# Patient Record
Sex: Male | Born: 1960 | Race: White | Hispanic: No | Marital: Married | Smoking: Never smoker
Health system: Southern US, Community
[De-identification: ages and names within clinical notes are randomized; demographics above are authoritative.]

## PROBLEM LIST (undated history)

## (undated) DIAGNOSIS — F1010 Alcohol abuse, uncomplicated: Secondary | ICD-10-CM

## (undated) DIAGNOSIS — K222 Esophageal obstruction: Secondary | ICD-10-CM

## (undated) DIAGNOSIS — K219 Gastro-esophageal reflux disease without esophagitis: Secondary | ICD-10-CM

## (undated) DIAGNOSIS — M549 Dorsalgia, unspecified: Secondary | ICD-10-CM

## (undated) DIAGNOSIS — I10 Essential (primary) hypertension: Secondary | ICD-10-CM

## (undated) DIAGNOSIS — F418 Other specified anxiety disorders: Secondary | ICD-10-CM

## (undated) DIAGNOSIS — E785 Hyperlipidemia, unspecified: Secondary | ICD-10-CM

## (undated) HISTORY — PX: BACK SURGERY: SHX140

## (undated) HISTORY — PX: ELBOW SURGERY: SHX618

---

## 2019-03-07 ENCOUNTER — Emergency Department: Payer: BC Managed Care – PPO

## 2019-03-07 ENCOUNTER — Observation Stay
Admission: EM | Admit: 2019-03-07 | Discharge: 2019-03-08 | Disposition: A | Discharge status: Home / Self Care | Payer: BC Managed Care – PPO | Attending: Internal Medicine | Admitting: Internal Medicine

## 2019-03-07 ENCOUNTER — Other Ambulatory Visit: Payer: Self-pay

## 2019-03-07 DIAGNOSIS — E860 Dehydration: Secondary | ICD-10-CM | POA: Diagnosis not present

## 2019-03-07 DIAGNOSIS — Z791 Long term (current) use of non-steroidal anti-inflammatories (NSAID): Secondary | ICD-10-CM | POA: Diagnosis not present

## 2019-03-07 DIAGNOSIS — N179 Acute kidney failure, unspecified: Secondary | ICD-10-CM | POA: Diagnosis present

## 2019-03-07 DIAGNOSIS — G9340 Encephalopathy, unspecified: Secondary | ICD-10-CM | POA: Diagnosis present

## 2019-03-07 DIAGNOSIS — K219 Gastro-esophageal reflux disease without esophagitis: Secondary | ICD-10-CM | POA: Diagnosis present

## 2019-03-07 DIAGNOSIS — Z0184 Encounter for antibody response examination: Secondary | ICD-10-CM | POA: Diagnosis not present

## 2019-03-07 DIAGNOSIS — F10129 Alcohol abuse with intoxication, unspecified: Secondary | ICD-10-CM | POA: Diagnosis not present

## 2019-03-07 DIAGNOSIS — R102 Pelvic and perineal pain: Secondary | ICD-10-CM | POA: Diagnosis not present

## 2019-03-07 DIAGNOSIS — W19XXXA Unspecified fall, initial encounter: Secondary | ICD-10-CM | POA: Diagnosis not present

## 2019-03-07 DIAGNOSIS — F418 Other specified anxiety disorders: Secondary | ICD-10-CM | POA: Insufficient documentation

## 2019-03-07 DIAGNOSIS — Z23 Encounter for immunization: Secondary | ICD-10-CM | POA: Insufficient documentation

## 2019-03-07 DIAGNOSIS — Z79899 Other long term (current) drug therapy: Secondary | ICD-10-CM | POA: Insufficient documentation

## 2019-03-07 DIAGNOSIS — F10929 Alcohol use, unspecified with intoxication, unspecified: Secondary | ICD-10-CM | POA: Diagnosis present

## 2019-03-07 DIAGNOSIS — I10 Essential (primary) hypertension: Secondary | ICD-10-CM | POA: Diagnosis not present

## 2019-03-07 DIAGNOSIS — Z20828 Contact with and (suspected) exposure to other viral communicable diseases: Secondary | ICD-10-CM | POA: Diagnosis not present

## 2019-03-07 DIAGNOSIS — Z7141 Alcohol abuse counseling and surveillance of alcoholic: Secondary | ICD-10-CM | POA: Diagnosis not present

## 2019-03-07 DIAGNOSIS — E785 Hyperlipidemia, unspecified: Secondary | ICD-10-CM | POA: Insufficient documentation

## 2019-03-07 DIAGNOSIS — G8929 Other chronic pain: Secondary | ICD-10-CM | POA: Diagnosis not present

## 2019-03-07 DIAGNOSIS — R296 Repeated falls: Secondary | ICD-10-CM | POA: Diagnosis not present

## 2019-03-07 HISTORY — DX: Essential (primary) hypertension: I10

## 2019-03-07 HISTORY — DX: Alcohol abuse, uncomplicated: F10.10

## 2019-03-07 HISTORY — DX: Hyperlipidemia, unspecified: E78.5

## 2019-03-07 HISTORY — DX: Other specified anxiety disorders: F41.8

## 2019-03-07 HISTORY — DX: Gastro-esophageal reflux disease without esophagitis: K21.9

## 2019-03-07 LAB — CBC WITH DIFFERENTIAL/PLATELET
Abs Immature Granulocytes: 0.03 10*3/uL (ref 0.00–0.07)
Basophils Absolute: 0.1 10*3/uL (ref 0.0–0.1)
Basophils Relative: 1 %
Eosinophils Absolute: 0.1 10*3/uL (ref 0.0–0.5)
Eosinophils Relative: 1 %
HCT: 42 % (ref 39.0–52.0)
Hemoglobin: 13.9 g/dL (ref 13.0–17.0)
Immature Granulocytes: 0 %
Lymphocytes Relative: 20 %
Lymphs Abs: 1.7 10*3/uL (ref 0.7–4.0)
MCH: 34 pg (ref 26.0–34.0)
MCHC: 33.1 g/dL (ref 30.0–36.0)
MCV: 102.7 fL — ABNORMAL HIGH (ref 80.0–100.0)
Monocytes Absolute: 1 10*3/uL (ref 0.1–1.0)
Monocytes Relative: 11 %
Neutro Abs: 5.7 10*3/uL (ref 1.7–7.7)
Neutrophils Relative %: 67 %
Platelets: 231 10*3/uL (ref 150–400)
RBC: 4.09 MIL/uL — ABNORMAL LOW (ref 4.22–5.81)
RDW: 12.3 % (ref 11.5–15.5)
WBC: 8.5 10*3/uL (ref 4.0–10.5)
nRBC: 0 % (ref 0.0–0.2)

## 2019-03-07 LAB — URINE DRUG SCREEN, QUALITATIVE (ARMC ONLY)
Amphetamines, Ur Screen: NOT DETECTED
Barbiturates, Ur Screen: NOT DETECTED
Benzodiazepine, Ur Scrn: POSITIVE — AB
Cannabinoid 50 Ng, Ur ~~LOC~~: NOT DETECTED
Cocaine Metabolite,Ur ~~LOC~~: NOT DETECTED
MDMA (Ecstasy)Ur Screen: NOT DETECTED
Methadone Scn, Ur: NOT DETECTED
Opiate, Ur Screen: POSITIVE — AB
Phencyclidine (PCP) Ur S: NOT DETECTED
Tricyclic, Ur Screen: POSITIVE — AB

## 2019-03-07 LAB — COMPREHENSIVE METABOLIC PANEL
ALT: 37 U/L (ref 0–44)
AST: 34 U/L (ref 15–41)
Albumin: 4 g/dL (ref 3.5–5.0)
Alkaline Phosphatase: 43 U/L (ref 38–126)
Anion gap: 11 (ref 5–15)
BUN: 39 mg/dL — ABNORMAL HIGH (ref 6–20)
CO2: 23 mmol/L (ref 22–32)
Calcium: 8.8 mg/dL — ABNORMAL LOW (ref 8.9–10.3)
Chloride: 112 mmol/L — ABNORMAL HIGH (ref 98–111)
Creatinine, Ser: 3.21 mg/dL — ABNORMAL HIGH (ref 0.61–1.24)
GFR calc Af Amer: 24 mL/min — ABNORMAL LOW (ref >60)
GFR calc non Af Amer: 21 mL/min — ABNORMAL LOW (ref >60)
Glucose, Bld: 98 mg/dL (ref 70–99)
Potassium: 4.1 mmol/L (ref 3.5–5.1)
Sodium: 146 mmol/L — ABNORMAL HIGH (ref 135–145)
Total Bilirubin: 0.7 mg/dL (ref 0.3–1.2)
Total Protein: 7 g/dL (ref 6.5–8.1)

## 2019-03-07 LAB — ETHANOL: Alcohol, Ethyl (B): 368 mg/dL (ref <10)

## 2019-03-07 LAB — SALICYLATE LEVEL: Salicylate Lvl: <7 mg/dL (ref 2.8–30.0)

## 2019-03-07 MED ORDER — SODIUM CHLORIDE 0.9 % IV BOLUS
1000.0000 mL | Freq: Once | INTRAVENOUS | Status: AC
  Administered 2019-03-07: 1000 mL via INTRAVENOUS

## 2019-03-07 MED ORDER — TETANUS-DIPHTH-ACELL PERTUSSIS 5-2.5-18.5 LF-MCG/0.5 IM SUSP
0.5000 mL | Freq: Once | INTRAMUSCULAR | Status: AC
  Administered 2019-03-08: 0.5 mL via INTRAMUSCULAR
  Filled 2019-03-07: qty 0.5

## 2019-03-07 MED ORDER — SODIUM CHLORIDE 0.9 % IV SOLN
Freq: Once | INTRAVENOUS | Status: AC
  Administered 2019-03-08: via INTRAVENOUS

## 2019-03-07 NOTE — ED Provider Notes (Signed)
[facility] Emergency Department Provider Note  ____________________________________________   First MD Initiated Contact with Patient 03/07/19 2117     (approximate)  I have reviewed the triage vital signs and the nursing notes.   HISTORY  Chief Complaint Fall    HPI Corey Baker is a 58 y.o. male here with fall and altered mental status.  The patient arrives with his wife.  He reportedly has been falling more than usual over the past several days.  He has been drinking alcohol as well.  He has not been eating and drinking very much.  He seems off balance, and is slurring his words more than usual.  He states that earlier today, he was on his porch and fell, striking his head.  He has headache, and diffuse body pain.  Is chronic pelvic pain that is mildly worsened.  Scribes as aching, throbbing pain that is worse with movement and palpation.  He is got bilateral chest wall pain.  Denies any fevers or chills.  No other complaints.  Of note, he has been taking more Goody's powders in addition of his usual medications for his pain.        Past Medical History:  Diagnosis Date  . Alcohol abuse   . Depression with anxiety   . GERD (gastroesophageal reflux disease)     There are no active problems to display for this patient.   History reviewed. No pertinent surgical history.  Prior to Admission medications   Medication Sig Start Date End Date Taking? Authorizing Provider  cyclobenzaprine (FLEXERIL) 10 MG tablet Take 10 mg by mouth 3 (three) times daily.   Yes [provider]  diazepam (VALIUM) 10 MG tablet Take 10 mg by mouth at bedtime.   Yes [provider]  DULoxetine (CYMBALTA) 30 MG capsule Take 30 mg by mouth daily.   Yes [provider]  meloxicam (MOBIC) 15 MG tablet Take 15 mg by mouth daily.   Yes [provider]  pantoprazole (PROTONIX) 40 MG tablet Take 40 mg by mouth at bedtime. 11/12/18  Yes [provider]  promethazine (PHENERGAN) 50 MG tablet Take 50-100 mg by mouth at bedtime as needed for nausea or vomiting.   Yes [provider]  tadalafil (CIALIS) 5 MG tablet Take 5 mg by mouth daily.   Yes [provider]    Allergies Patient has no allergy information on record.  No family history on file.  Social History Social History   Tobacco Use  . Smoking status: Never Smoker  . Smokeless tobacco: Never Used  Substance Use Topics  . Alcohol use: Yes  . Drug use: Never    Review of Systems  Review of Systems  Constitutional: Positive for fatigue. Negative for chills and fever.  HENT: Negative for sore throat.   Respiratory: Negative for shortness of breath.   Cardiovascular: Negative for chest pain.  Gastrointestinal: Negative for abdominal pain.  Genitourinary: Negative for flank pain.  Musculoskeletal: Positive for arthralgias, gait problem and myalgias. Negative for neck pain.  Skin: Negative for rash and wound.  Allergic/Immunologic: Negative for immunocompromised state.  Neurological: Positive for weakness. Negative for numbness.  Hematological: Does not bruise/bleed easily.  All other systems reviewed and are negative.    ____________________________________________  PHYSICAL EXAM:      VITAL SIGNS: ED Triage Vitals  Enc Vitals Group     BP --      Pulse Rate 03/07/19 2112 (!) 102  Resp 03/07/19 2112 16     Temp 03/07/19 2112 98.2 F (36.8 C)     Temp Source 03/07/19 2112 Oral     SpO2 03/07/19 2112 99 %     Weight 03/07/19 2113 180 lb (81.6 kg)     Height 03/07/19 2113 6' (1.829 m)     Head Circumference --      Peak Flow --      Pain Score 03/07/19 2113 0     Pain Loc --      Pain Edu? --      Excl. in GC? --      Physical Exam Vitals signs and nursing note reviewed.  Constitutional:      General: He is not in acute distress.    Appearance: He is well-developed.  HENT:     Head: Normocephalic and atraumatic.      Comments: Superficial excoriation and abrasion to the left brow.  No proptosis.  Extraocular movements intact. Eyes:     Conjunctiva/sclera: Conjunctivae normal.  Neck:     Musculoskeletal: Neck supple.  Cardiovascular:     Rate and Rhythm: Normal rate and regular rhythm.     Heart sounds: Normal heart sounds. No murmur. No friction rub.  Pulmonary:     Effort: Pulmonary effort is normal. No respiratory distress.     Breath sounds: Normal breath sounds. No wheezing or rales.  Abdominal:     General: There is no distension.     Palpations: Abdomen is soft.     Tenderness: There is no abdominal tenderness.  Musculoskeletal:     Comments: Diffuse tenderness to bilateral upper and lower extremities, without pinpoint tenderness anywhere.  Skin:    General: Skin is warm.     Capillary Refill: Capillary refill takes less than 2 seconds.  Neurological:     Mental Status: He is alert and oriented to person, place, and time.     Motor: No abnormal muscle tone.       ____________________________________________   LABS (all labs ordered are listed, but only abnormal results are displayed)  Labs Reviewed  CBC WITH DIFFERENTIAL/PLATELET - Abnormal; Notable for the following components:      Result Value   RBC 4.09 (*)    MCV 102.7 (*)    All other components within normal limits  ETHANOL - Abnormal; Notable for the following components:   Alcohol, Ethyl (B) 368 (*)    All other components within normal limits  COMPREHENSIVE METABOLIC PANEL - Abnormal; Notable for the following components:   Sodium 146 (*)    Chloride 112 (*)    BUN 39 (*)    Creatinine, Ser 3.21 (*)    Calcium 8.8 (*)    GFR calc non Af Amer 21 (*)    GFR calc Af Amer 24 (*)    All other components within normal limits  SALICYLATE LEVEL  URINE DRUG SCREEN, QUALITATIVE ([facility] ONLY)   ____________________________  RADIOLOGY All imaging, including plain films, CT scans, and ultrasounds, independently reviewed  by me, and interpretations confirmed via formal radiology reads.  ED MD interpretation:   CT Head/C-Spine: NAICA, neg C-Spine CXR: Neg PXR: Neg  Official radiology report(s): Dg Chest 2 View  Result Date: 03/07/2019 CLINICAL DATA:  Shortness of breath EXAM: CHEST - 2 VIEW COMPARISON:  03/17/2018 FINDINGS: Low lung volumes with bibasilar atelectasis. Heart is normal size. No effusions. No acute bony abnormality. IMPRESSION: Low volumes, bibasilar atelectasis. Electronically Signed   By: Charlett NoseKevin  Dover  M.D.   On: 03/07/2019 22:01   Dg Pelvis 1-2 Views  Result Date: 03/07/2019 CLINICAL DATA:  Shortness of breath, intoxicated, fall EXAM: PELVIS - 1-2 VIEW COMPARISON:  Pelvic radiograph 03/17/2018, CT 03/17/2018 FINDINGS: No acute pelvic fractures or diastasis. Remote posttraumatic deformity of the pubic root and inferior rami bilaterally. The femoral heads remain normally located. Proximal femora are intact. Mild degenerative changes in the lower lumbar spine and both hips. Enthesopathic changes noted on the iliac crests. IMPRESSION: No acute osseous abnormality. Electronically Signed   By: Kreg Shropshire M.D.   On: 03/07/2019 22:03   Ct Head Wo Contrast  Result Date: 03/07/2019 CLINICAL DATA:  58 year old male with altered mental status. EXAM: CT HEAD WITHOUT CONTRAST CT CERVICAL SPINE WITHOUT CONTRAST TECHNIQUE: Multidetector CT imaging of the head and cervical spine was performed following the standard protocol without intravenous contrast. Multiplanar CT image reconstructions of the cervical spine were also generated. COMPARISON:  Head CT dated 03/17/2018 FINDINGS: CT HEAD FINDINGS Brain: There is mild age-related atrophy and chronic microvascular ischemic changes. There is no acute intracranial hemorrhage. No mass effect or midline shift. No extra-axial fluid collection. Vascular: No hyperdense vessel or unexpected calcification. Skull: Normal. Negative for fracture or focal lesion. Sinuses/Orbits: No  acute finding. Other: None CT CERVICAL SPINE FINDINGS Alignment: No acute subluxation. There is straightening of normal cervical lordosis which may be positional or due to muscle spasm. Skull base and vertebrae: No acute fracture. No primary bone lesion or focal pathologic process. Soft tissues and spinal canal: No prevertebral fluid or swelling. No visible canal hematoma. Disc levels: No acute findings. Degenerative changes with disc space narrowing most prominent at C4-C5. Upper chest: Negative. Other: None IMPRESSION: 1. No acute intracranial hemorrhage. Mild age-related atrophy and chronic microvascular ischemic changes. 2. No acute/traumatic cervical spine pathology. Electronically Signed   By: Elgie Collard M.D.   On: 03/07/2019 21:57   Ct Cervical Spine Wo Contrast  Result Date: 03/07/2019 CLINICAL DATA:  58 year old male with altered mental status. EXAM: CT HEAD WITHOUT CONTRAST CT CERVICAL SPINE WITHOUT CONTRAST TECHNIQUE: Multidetector CT imaging of the head and cervical spine was performed following the standard protocol without intravenous contrast. Multiplanar CT image reconstructions of the cervical spine were also generated. COMPARISON:  Head CT dated 03/17/2018 FINDINGS: CT HEAD FINDINGS Brain: There is mild age-related atrophy and chronic microvascular ischemic changes. There is no acute intracranial hemorrhage. No mass effect or midline shift. No extra-axial fluid collection. Vascular: No hyperdense vessel or unexpected calcification. Skull: Normal. Negative for fracture or focal lesion. Sinuses/Orbits: No acute finding. Other: None CT CERVICAL SPINE FINDINGS Alignment: No acute subluxation. There is straightening of normal cervical lordosis which may be positional or due to muscle spasm. Skull base and vertebrae: No acute fracture. No primary bone lesion or focal pathologic process. Soft tissues and spinal canal: No prevertebral fluid or swelling. No visible canal hematoma. Disc levels: No  acute findings. Degenerative changes with disc space narrowing most prominent at C4-C5. Upper chest: Negative. Other: None IMPRESSION: 1. No acute intracranial hemorrhage. Mild age-related atrophy and chronic microvascular ischemic changes. 2. No acute/traumatic cervical spine pathology. Electronically Signed   By: Elgie Collard M.D.   On: 03/07/2019 21:57    ____________________________________________  PROCEDURES   Procedure(s) performed (including Critical Care):  Procedures  ____________________________________________  INITIAL IMPRESSION / MDM / ASSESSMENT AND PLAN / ED COURSE  As part of my medical decision making, I reviewed the following data within the electronic MEDICAL RECORD NUMBER  Notes from prior ED visits and [place] Controlled Substance Database      *Josefina DoJoe Reasons was evaluated in Emergency Department on 03/07/2019 for the symptoms described in the history of present illness. He was evaluated in the context of the global COVID-19 pandemic, which necessitated consideration that the patient might be at risk for infection with the SARS-CoV-2 virus that causes COVID-19. Institutional protocols and algorithms that pertain to the evaluation of patients at risk for COVID-19 are in a state of rapid change based on information released by regulatory bodies including the CDC and federal and state organizations. These policies and algorithms were followed during the patient's care in the ED.  Some ED evaluations and interventions may be delayed as a result of limited staffing during the pandemic.*      Medical Decision Making: 58 year old male here with altered mental status.  I suspect this is multifactorial, primarily secondary to polypharmacy and alcohol intoxication, likely made worse by acute on chronic kidney injury causing renal insufficiency with decreased clearance of his medications.  He has no focal neurological deficits.  No fevers or symptoms to suggest infection.  Will admit for  hydration and monitoring.  ____________________________________________  FINAL CLINICAL IMPRESSION(S) / ED DIAGNOSES  Final diagnoses:  Fall, initial encounter  Encephalopathy  Acute kidney injury (HCC)  Polypharmacy     MEDICATIONS GIVEN DURING THIS VISIT:  Medications  0.9 %  sodium chloride infusion (has no administration in time range)  sodium chloride 0.9 % bolus 1,000 mL (1,000 mLs Intravenous New Bag/Given 03/07/19 2200)     ED Discharge Orders    None       Note:  This document was prepared using Dragon voice recognition software and may include unintentional dictation errors.   Shaune PollackIsaacs, Rayaan Lorah, MD 03/07/19 2306

## 2019-03-07 NOTE — H&P (Signed)
[facility] [place] [facility]   PATIENT NAME: Corey Baker    MR#:  [id number]  DATE OF BIRTH:  1960/09/18  DATE OF ADMISSION:  03/07/2019  PRIMARY CARE PHYSICIAN: Patient, No Pcp Per   REQUESTING/REFERRING PHYSICIAN: Erma Heritage, MD  CHIEF COMPLAINT:   Chief Complaint  Patient presents with  . Fall    HISTORY OF PRESENT ILLNESS:  Corey Baker  is a 58 y.o. male who presents with chief complaint as above. Patient presents to the ED with complaint of more frequent falls.  He is intoxicated.  He reportedly sustained severe pelvic injury recently, by his report.  He has been receiving PT, but states he has significant pain and has had a hard time waking well again.  He is found in ED to have AKI as well.  Hospitalists called for admission  PAST MEDICAL HISTORY:   Past Medical History:  Diagnosis Date  . Alcohol abuse   . Depression with anxiety   . GERD (gastroesophageal reflux disease)   . HLD (hyperlipidemia)   . HTN (hypertension)      PAST SURGICAL HISTORY:  History reviewed. No pertinent surgical history.  Pt denies past surgeries SOCIAL HISTORY:   Social History   Tobacco Use  . Smoking status: Never Smoker  . Smokeless tobacco: Never Used  Substance Use Topics  . Alcohol use: Yes     FAMILY HISTORY:    Family history reviewed and is non-contributory DRUG ALLERGIES:  Not on File  MEDICATIONS AT HOME:   Prior to Admission medications   Medication Sig Start Date End Date Taking? Authorizing Provider  cyclobenzaprine (FLEXERIL) 10 MG tablet Take 10 mg by mouth 3 (three) times daily.   Yes [provider]  diazepam (VALIUM) 10 MG tablet Take 10 mg by mouth at bedtime.   Yes [provider]  DULoxetine (CYMBALTA) 30 MG capsule Take 30 mg by mouth daily.   Yes [provider]  meloxicam (MOBIC) 15 MG tablet Take 15 mg by mouth daily.   Yes [provider]  pantoprazole (PROTONIX) 40 MG tablet  Take 40 mg by mouth at bedtime. 11/12/18  Yes [provider]  promethazine (PHENERGAN) 50 MG tablet Take 50-100 mg by mouth at bedtime as needed for nausea or vomiting.   Yes [provider]  tadalafil (CIALIS) 5 MG tablet Take 5 mg by mouth daily.   Yes [provider]    REVIEW OF SYSTEMS:  Review of Systems  Unable to perform ROS: Acuity of condition     VITAL SIGNS:   Vitals:   03/07/19 2112 03/07/19 2113  Pulse: (!) 102   Resp: 16   Temp: 98.2 F (36.8 C)   TempSrc: Oral   SpO2: 99%   Weight:  81.6 kg  Height:  6' (1.829 m)   Wt Readings from Last 3 Encounters:  03/07/19 81.6 kg    PHYSICAL EXAMINATION:  Physical Exam  Vitals reviewed. Constitutional: He appears well-developed and well-nourished. No distress.  HENT:  Head: Normocephalic and atraumatic.  Mouth/Throat: Oropharynx is clear and moist.  Eyes: Pupils are equal, round, and reactive to light. Conjunctivae and EOM are normal. No scleral icterus.  Neck: Normal range of motion. Neck supple. No JVD present. No thyromegaly present.  Cardiovascular: Regular rhythm and intact distal pulses. Exam reveals no gallop and no friction rub.  No murmur heard. tachycardic  Respiratory: Effort normal and breath sounds normal. No respiratory distress. He has no wheezes. He  has no rales.  GI: Soft. Bowel sounds are normal. He exhibits no distension. There is no abdominal tenderness.  Musculoskeletal: Normal range of motion.        General: No edema.     Comments: No arthritis, no gout  Lymphadenopathy:    He has no cervical adenopathy.  Neurological: He is alert. No cranial nerve deficit.  No dysarthria, no aphasia, confused, without focal neurological deficit  Skin: Skin is warm and dry. No rash noted. No erythema.  Psychiatric:  Unable to fully assess due to intoxication    LABORATORY PANEL:   CBC Recent Labs  Lab 03/07/19 2113  WBC 8.5  HGB 13.9  HCT 42.0  PLT 231    ------------------------------------------------------------------------------------------------------------------  Chemistries  Recent Labs  Lab 03/07/19 2113  NA 146*  K 4.1  CL 112*  CO2 23  GLUCOSE 98  BUN 39*  CREATININE 3.21*  CALCIUM 8.8*  AST 34  ALT 37  ALKPHOS 43  BILITOT 0.7   ------------------------------------------------------------------------------------------------------------------  Cardiac Enzymes No results for input(s): TROPONINI in the last 168 hours. ------------------------------------------------------------------------------------------------------------------  RADIOLOGY:  Dg Chest 2 View  Result Date: 03/07/2019 CLINICAL DATA:  Shortness of breath EXAM: CHEST - 2 VIEW COMPARISON:  03/17/2018 FINDINGS: Low lung volumes with bibasilar atelectasis. Heart is normal size. No effusions. No acute bony abnormality. IMPRESSION: Low volumes, bibasilar atelectasis. Electronically Signed   By: Charlett NoseKevin  Dover M.D.   On: 03/07/2019 22:01   Dg Pelvis 1-2 Views  Result Date: 03/07/2019 CLINICAL DATA:  Shortness of breath, intoxicated, fall EXAM: PELVIS - 1-2 VIEW COMPARISON:  Pelvic radiograph 03/17/2018, CT 03/17/2018 FINDINGS: No acute pelvic fractures or diastasis. Remote posttraumatic deformity of the pubic root and inferior rami bilaterally. The femoral heads remain normally located. Proximal femora are intact. Mild degenerative changes in the lower lumbar spine and both hips. Enthesopathic changes noted on the iliac crests. IMPRESSION: No acute osseous abnormality. Electronically Signed   By: Kreg ShropshirePrice  DeHay M.D.   On: 03/07/2019 22:03   Ct Head Wo Contrast  Result Date: 03/07/2019 CLINICAL DATA:  58 year old male with altered mental status. EXAM: CT HEAD WITHOUT CONTRAST CT CERVICAL SPINE WITHOUT CONTRAST TECHNIQUE: Multidetector CT imaging of the head and cervical spine was performed following the standard protocol without intravenous contrast. Multiplanar CT  image reconstructions of the cervical spine were also generated. COMPARISON:  Head CT dated 03/17/2018 FINDINGS: CT HEAD FINDINGS Brain: There is mild age-related atrophy and chronic microvascular ischemic changes. There is no acute intracranial hemorrhage. No mass effect or midline shift. No extra-axial fluid collection. Vascular: No hyperdense vessel or unexpected calcification. Skull: Normal. Negative for fracture or focal lesion. Sinuses/Orbits: No acute finding. Other: None CT CERVICAL SPINE FINDINGS Alignment: No acute subluxation. There is straightening of normal cervical lordosis which may be positional or due to muscle spasm. Skull base and vertebrae: No acute fracture. No primary bone lesion or focal pathologic process. Soft tissues and spinal canal: No prevertebral fluid or swelling. No visible canal hematoma. Disc levels: No acute findings. Degenerative changes with disc space narrowing most prominent at C4-C5. Upper chest: Negative. Other: None IMPRESSION: 1. No acute intracranial hemorrhage. Mild age-related atrophy and chronic microvascular ischemic changes. 2. No acute/traumatic cervical spine pathology. Electronically Signed   By: Elgie CollardArash  Radparvar M.D.   On: 03/07/2019 21:57   Ct Cervical Spine Wo Contrast  Result Date: 03/07/2019 CLINICAL DATA:  58 year old male with altered mental status. EXAM: CT HEAD WITHOUT CONTRAST CT CERVICAL SPINE WITHOUT CONTRAST TECHNIQUE: Multidetector  CT imaging of the head and cervical spine was performed following the standard protocol without intravenous contrast. Multiplanar CT image reconstructions of the cervical spine were also generated. COMPARISON:  Head CT dated 03/17/2018 FINDINGS: CT HEAD FINDINGS Brain: There is mild age-related atrophy and chronic microvascular ischemic changes. There is no acute intracranial hemorrhage. No mass effect or midline shift. No extra-axial fluid collection. Vascular: No hyperdense vessel or unexpected calcification. Skull:  Normal. Negative for fracture or focal lesion. Sinuses/Orbits: No acute finding. Other: None CT CERVICAL SPINE FINDINGS Alignment: No acute subluxation. There is straightening of normal cervical lordosis which may be positional or due to muscle spasm. Skull base and vertebrae: No acute fracture. No primary bone lesion or focal pathologic process. Soft tissues and spinal canal: No prevertebral fluid or swelling. No visible canal hematoma. Disc levels: No acute findings. Degenerative changes with disc space narrowing most prominent at C4-C5. Upper chest: Negative. Other: None IMPRESSION: 1. No acute intracranial hemorrhage. Mild age-related atrophy and chronic microvascular ischemic changes. 2. No acute/traumatic cervical spine pathology. Electronically Signed   By: Anner Crete M.D.   On: 03/07/2019 21:57    EKG:   Orders placed or performed during the hospital encounter of 03/07/19  . ED EKG  . ED EKG    IMPRESSION AND PLAN:  Principal Problem:   Alcoholic intoxication with complication ([place]) - CIWA protocol Active Problems:   AKI - Creatinine of 3 here today, baseline is ~1.  IV fluids, avoid nephrotoxins and monitor.   HTN (hypertension) - home dose antihypertensives   HLD (hyperlipidemia) - home dose antilipid   GERD (gastroesophageal reflux disease) - home dose PPI   Chart review performed and case discussed with ED provider. Labs, imaging and/or ECG reviewed by provider and discussed with patient/family. Management plans discussed with the patient and/or family.  COVID-19 status: Pending  DVT PROPHYLAXIS: SubQ heparin  GI PROPHYLAXIS:  PPI   ADMISSION STATUS: Observation  CODE STATUS: Full  TOTAL TIME TAKING CARE OF THIS PATIENT: 40 minutes.   This patient was evaluated in the context of the global COVID-19 pandemic, which necessitated consideration that the patient might be at risk for infection with the SARS-CoV-2 virus that causes COVID-19. Institutional protocols and  algorithms that pertain to the evaluation of patients at risk for COVID-19 are in a state of rapid change based on information released by regulatory bodies including the CDC and federal and state organizations. These policies and algorithms were followed to the best of this provider's knowledge to date during the patient's care at this facility.  Ethlyn Daniels 03/07/2019, 11:32 PM  [facility]  Office  [phone]  CC: Primary care physician; Patient, No Pcp Per  Note:  This document was prepared using Dragon voice recognition software and may include unintentional dictation errors.

## 2019-03-07 NOTE — ED Triage Notes (Signed)
Pt to ED via EMS from home. Pt arrives intoxicated with abrasions to head and bilateral hands. Pt states he had 2 beers earlier and fell. Pt denies any pain. Pt moving all extremities, speech clear.

## 2019-03-08 ENCOUNTER — Encounter: Payer: Self-pay | Admitting: Internal Medicine

## 2019-03-08 ENCOUNTER — Other Ambulatory Visit: Payer: Self-pay

## 2019-03-08 LAB — BASIC METABOLIC PANEL
Anion gap: 11 (ref 5–15)
BUN: 29 mg/dL — ABNORMAL HIGH (ref 6–20)
CO2: 22 mmol/L (ref 22–32)
Calcium: 8.1 mg/dL — ABNORMAL LOW (ref 8.9–10.3)
Chloride: 113 mmol/L — ABNORMAL HIGH (ref 98–111)
Creatinine, Ser: 1.62 mg/dL — ABNORMAL HIGH (ref 0.61–1.24)
GFR calc Af Amer: 55 mL/min — ABNORMAL LOW (ref >60)
GFR calc non Af Amer: 47 mL/min — ABNORMAL LOW (ref >60)
Glucose, Bld: 80 mg/dL (ref 70–99)
Potassium: 4.5 mmol/L (ref 3.5–5.1)
Sodium: 146 mmol/L — ABNORMAL HIGH (ref 135–145)

## 2019-03-08 LAB — CBC
HCT: 37.7 % — ABNORMAL LOW (ref 39.0–52.0)
Hemoglobin: 12.6 g/dL — ABNORMAL LOW (ref 13.0–17.0)
MCH: 34.3 pg — ABNORMAL HIGH (ref 26.0–34.0)
MCHC: 33.4 g/dL (ref 30.0–36.0)
MCV: 102.7 fL — ABNORMAL HIGH (ref 80.0–100.0)
Platelets: 175 10*3/uL (ref 150–400)
RBC: 3.67 MIL/uL — ABNORMAL LOW (ref 4.22–5.81)
RDW: 12.3 % (ref 11.5–15.5)
WBC: 6.3 10*3/uL (ref 4.0–10.5)
nRBC: 0 % (ref 0.0–0.2)

## 2019-03-08 MED ORDER — LORAZEPAM 1 MG PO TABS: 1.0000 mg | ORAL_TABLET | Freq: Four times a day (QID) | ORAL | Status: DC | PRN

## 2019-03-08 MED ORDER — THIAMINE HCL 100 MG/ML IJ SOLN: 100.0000 mg | Freq: Every day | INTRAMUSCULAR | Status: DC

## 2019-03-08 MED ORDER — ADULT MULTIVITAMIN W/MINERALS CH
1.0000 | ORAL_TABLET | Freq: Every day | ORAL | Status: DC
  Administered 2019-03-08: 10:00:00 1 via ORAL
  Filled 2019-03-08: qty 1

## 2019-03-08 MED ORDER — LORAZEPAM 2 MG/ML IJ SOLN: 0.0000 mg | Freq: Two times a day (BID) | INTRAMUSCULAR | Status: DC

## 2019-03-08 MED ORDER — LORAZEPAM 2 MG/ML IJ SOLN: 0.0000 mg | Freq: Four times a day (QID) | INTRAMUSCULAR | Status: DC

## 2019-03-08 MED ORDER — ACETAMINOPHEN 325 MG PO TABS: 650.0000 mg | ORAL_TABLET | Freq: Four times a day (QID) | ORAL | Status: DC | PRN

## 2019-03-08 MED ORDER — ONDANSETRON HCL 4 MG/2ML IJ SOLN: 4.0000 mg | Freq: Four times a day (QID) | INTRAMUSCULAR | Status: DC | PRN

## 2019-03-08 MED ORDER — ACETAMINOPHEN 650 MG RE SUPP: 650.0000 mg | Freq: Four times a day (QID) | RECTAL | Status: DC | PRN

## 2019-03-08 MED ORDER — ONDANSETRON HCL 4 MG PO TABS: 4.0000 mg | ORAL_TABLET | Freq: Four times a day (QID) | ORAL | Status: DC | PRN

## 2019-03-08 MED ORDER — LORAZEPAM 2 MG/ML IJ SOLN: 1.0000 mg | Freq: Four times a day (QID) | INTRAMUSCULAR | Status: DC | PRN

## 2019-03-08 MED ORDER — FOLIC ACID 1 MG PO TABS
1.0000 mg | ORAL_TABLET | Freq: Every day | ORAL | Status: DC
  Administered 2019-03-08: 10:00:00 1 mg via ORAL
  Filled 2019-03-08: qty 1

## 2019-03-08 MED ORDER — HEPARIN SODIUM (PORCINE) 5000 UNIT/ML IJ SOLN
5000.0000 [IU] | Freq: Three times a day (TID) | INTRAMUSCULAR | Status: DC
  Administered 2019-03-08: 5000 [IU] via SUBCUTANEOUS

## 2019-03-08 MED ORDER — VITAMIN B-1 100 MG PO TABS
100.0000 mg | ORAL_TABLET | Freq: Every day | ORAL | Status: DC
  Administered 2019-03-08: 10:00:00 100 mg via ORAL
  Filled 2019-03-08: qty 1

## 2019-03-08 NOTE — Progress Notes (Signed)
Advanced care plan. Purpose of the Encounter: CODE STATUS Parties in Attendance: Patient Patient's Decision Capacity: Good Subjective/Patient's story: Corey Baker  is a 58 y.o. male who presents with chief complaint as above. Patient presents to the ED with complaint of more frequent falls.  He is intoxicated.  He reportedly sustained severe pelvic injury recently, by his report.  He has been receiving PT, but states he has significant pain and has had a hard time waking well again.  He is found in ED to have AKI as well.  Objective/Medical story Patient needs IV fluids and renal function monitoring Appears dehydrated Needs CIWA protocol for alcohol abuse Goals of care determination:  Advance care directives goals of care treatment plan discussed No patient wants everything done which includes CPR, intubation ventilator if the need arises CODE STATUS: Full code Time spent discussing advanced care planning: 16 minutes

## 2019-03-08 NOTE — Plan of Care (Signed)
  Problem: Activity: Goal: Risk for activity intolerance will decrease Outcome: Progressing   Patient walked 139ft around nurses station without any assistive devices.

## 2019-03-08 NOTE — Progress Notes (Signed)
Patient cleared for discharge.   Education complete. AVS printed. Discharge instructions given. All questions answered for patient clarification.  Prescriptions given, pharmacy verified.  IV removed.  Discharged to home via POV     

## 2019-03-08 NOTE — ED Notes (Signed)
Attempted to call report

## 2019-03-08 NOTE — Discharge Summary (Signed)
[facility] [place] NAME: Corey Baker    MR#:  [id number]  DATE OF BIRTH:  1960/09/03  DATE OF ADMISSION:  03/07/2019 ADMITTING PHYSICIAN: Lance Coon, MD  DATE OF DISCHARGE: 03/08/2019  PRIMARY CARE PHYSICIAN: Patient, No Pcp Per   ADMISSION DIAGNOSIS:  Encephalopathy [G93.40] Polypharmacy [Z79.899] Acute kidney injury ([place]) [N17.9] Fall, initial encounter [W19.XXXA]  DISCHARGE DIAGNOSIS:  Principal Problem:   Alcoholic intoxication with complication ([place]) Active Problems:   HTN (hypertension)   HLD (hyperlipidemia)   GERD (gastroesophageal reflux disease) Dehydration Acute kidney injury  SECONDARY DIAGNOSIS:   Past Medical History:  Diagnosis Date  . Alcohol abuse   . Depression with anxiety   . GERD (gastroesophageal reflux disease)   . HLD (hyperlipidemia)   . HTN (hypertension)      ADMITTING HISTORY Corey Baker  is a 58 y.o. male who presents with chief complaint as above. Patient presents to the ED with complaint of more frequent falls.  He is intoxicated.  He reportedly sustained severe pelvic injury recently, by his report.  He has been receiving PT, but states he has significant pain and has had a hard time waking well again.  He is found in ED to have AKI as well  HOSPITAL COURSE:  Patient admitted to medical floor.  He was worked up with CT head, CT cervical spine and x-ray of the chest.  Patient was dehydrated with renal failure.  He received IV fluids and renal function improved.  Patient was placed on CIWA protocol for alcohol abuse.  Thiamine and folic acid supplements were given.  Alcohol cessation was counseled to the patient.  Advised patient to join alcohol Anonymous groups in the community for support.  CONSULTS OBTAINED:    DRUG ALLERGIES:  Not on File  DISCHARGE MEDICATIONS:   Allergies as of 03/08/2019   Not on File     Medication List    STOP taking these medications   promethazine 50 MG  tablet Commonly known as: PHENERGAN     TAKE these medications   cyclobenzaprine 10 MG tablet Commonly known as: FLEXERIL Take 10 mg by mouth 3 (three) times daily.   diazepam 10 MG tablet Commonly known as: VALIUM Take 10 mg by mouth at bedtime.   DULoxetine 30 MG capsule Commonly known as: CYMBALTA Take 30 mg by mouth daily.   meloxicam 15 MG tablet Commonly known as: MOBIC Take 15 mg by mouth daily.   pantoprazole 40 MG tablet Commonly known as: PROTONIX Take 40 mg by mouth at bedtime.   tadalafil 5 MG tablet Commonly known as: CIALIS Take 5 mg by mouth daily.       Today  Patient seen today Tolerated diet well Hemodynamically stable VITAL SIGNS:  Blood pressure (!) 153/93, pulse 81, temperature 97.6 F (36.4 C), temperature source Oral, resp. rate 18, height 6' (1.829 m), weight 81.6 kg, SpO2 93 %.  I/O:    Intake/Output Summary (Last 24 hours) at 03/08/2019 1206 Last data filed at 03/08/2019 0600 Gross per 24 hour  Intake 1240 ml  Output 400 ml  Net 840 ml    PHYSICAL EXAMINATION:  Physical Exam  GENERAL:  58 y.o.-year-old patient lying in the bed with no acute distress.  LUNGS: Normal breath sounds bilaterally, no wheezing, rales,rhonchi or crepitation. No use of accessory muscles of respiration.  CARDIOVASCULAR: S1, S2 normal. No murmurs, rubs, or gallops.  ABDOMEN: Soft, non-tender, non-distended. Bowel sounds present. No organomegaly or mass.  NEUROLOGIC: Moves all 4 extremities. PSYCHIATRIC: The patient is alert and oriented x 3.  SKIN: No obvious rash, lesion, or ulcer.   DATA REVIEW:   CBC Recent Labs  Lab 03/08/19 0450  WBC 6.3  HGB 12.6*  HCT 37.7*  PLT 175    Chemistries  Recent Labs  Lab 03/07/19 2113 03/08/19 0450  NA 146* 146*  K 4.1 4.5  CL 112* 113*  CO2 23 22  GLUCOSE 98 80  BUN 39* 29*  CREATININE 3.21* 1.62*  CALCIUM 8.8* 8.1*  AST 34  --   ALT 37  --   ALKPHOS 43  --   BILITOT 0.7  --     Cardiac  Enzymes No results for input(s): TROPONINI in the last 168 hours.  Microbiology Results  No results found for this or any previous visit.  RADIOLOGY:  Dg Chest 2 View  Result Date: 03/07/2019 CLINICAL DATA:  Shortness of breath EXAM: CHEST - 2 VIEW COMPARISON:  03/17/2018 FINDINGS: Low lung volumes with bibasilar atelectasis. Heart is normal size. No effusions. No acute bony abnormality. IMPRESSION: Low volumes, bibasilar atelectasis. Electronically Signed   By: Charlett NoseKevin  Dover M.D.   On: 03/07/2019 22:01   Dg Pelvis 1-2 Views  Result Date: 03/07/2019 CLINICAL DATA:  Shortness of breath, intoxicated, fall EXAM: PELVIS - 1-2 VIEW COMPARISON:  Pelvic radiograph 03/17/2018, CT 03/17/2018 FINDINGS: No acute pelvic fractures or diastasis. Remote posttraumatic deformity of the pubic root and inferior rami bilaterally. The femoral heads remain normally located. Proximal femora are intact. Mild degenerative changes in the lower lumbar spine and both hips. Enthesopathic changes noted on the iliac crests. IMPRESSION: No acute osseous abnormality. Electronically Signed   By: Kreg ShropshirePrice  DeHay M.D.   On: 03/07/2019 22:03   Ct Head Wo Contrast  Result Date: 03/07/2019 CLINICAL DATA:  58 year old male with altered mental status. EXAM: CT HEAD WITHOUT CONTRAST CT CERVICAL SPINE WITHOUT CONTRAST TECHNIQUE: Multidetector CT imaging of the head and cervical spine was performed following the standard protocol without intravenous contrast. Multiplanar CT image reconstructions of the cervical spine were also generated. COMPARISON:  Head CT dated 03/17/2018 FINDINGS: CT HEAD FINDINGS Brain: There is mild age-related atrophy and chronic microvascular ischemic changes. There is no acute intracranial hemorrhage. No mass effect or midline shift. No extra-axial fluid collection. Vascular: No hyperdense vessel or unexpected calcification. Skull: Normal. Negative for fracture or focal lesion. Sinuses/Orbits: No acute finding. Other:  None CT CERVICAL SPINE FINDINGS Alignment: No acute subluxation. There is straightening of normal cervical lordosis which may be positional or due to muscle spasm. Skull base and vertebrae: No acute fracture. No primary bone lesion or focal pathologic process. Soft tissues and spinal canal: No prevertebral fluid or swelling. No visible canal hematoma. Disc levels: No acute findings. Degenerative changes with disc space narrowing most prominent at C4-C5. Upper chest: Negative. Other: None IMPRESSION: 1. No acute intracranial hemorrhage. Mild age-related atrophy and chronic microvascular ischemic changes. 2. No acute/traumatic cervical spine pathology. Electronically Signed   By: Elgie CollardArash  Radparvar M.D.   On: 03/07/2019 21:57   Ct Cervical Spine Wo Contrast  Result Date: 03/07/2019 CLINICAL DATA:  58 year old male with altered mental status. EXAM: CT HEAD WITHOUT CONTRAST CT CERVICAL SPINE WITHOUT CONTRAST TECHNIQUE: Multidetector CT imaging of the head and cervical spine was performed following the standard protocol without intravenous contrast. Multiplanar CT image reconstructions of the cervical spine were also generated. COMPARISON:  Head CT dated 03/17/2018 FINDINGS: CT HEAD FINDINGS Brain: There is mild  age-related atrophy and chronic microvascular ischemic changes. There is no acute intracranial hemorrhage. No mass effect or midline shift. No extra-axial fluid collection. Vascular: No hyperdense vessel or unexpected calcification. Skull: Normal. Negative for fracture or focal lesion. Sinuses/Orbits: No acute finding. Other: None CT CERVICAL SPINE FINDINGS Alignment: No acute subluxation. There is straightening of normal cervical lordosis which may be positional or due to muscle spasm. Skull base and vertebrae: No acute fracture. No primary bone lesion or focal pathologic process. Soft tissues and spinal canal: No prevertebral fluid or swelling. No visible canal hematoma. Disc levels: No acute findings.  Degenerative changes with disc space narrowing most prominent at C4-C5. Upper chest: Negative. Other: None IMPRESSION: 1. No acute intracranial hemorrhage. Mild age-related atrophy and chronic microvascular ischemic changes. 2. No acute/traumatic cervical spine pathology. Electronically Signed   By: Elgie Collard M.D.   On: 03/07/2019 21:57    Follow up with PCP in 1 week.  Management plans discussed with the patient, family and they are in agreement.  CODE STATUS: Full code    Code Status Orders  (From admission, onward)         Start     Ordered   03/08/19 0228  Full code  Continuous     03/08/19 0227        Code Status History    This patient has a current code status but no historical code status.   Advance Care Planning Activity      TOTAL TIME TAKING CARE OF THIS PATIENT ON DAY OF DISCHARGE: more than 33 minutes.   Ihor Austin M.D on 03/08/2019 at 12:06 PM  Between 7am to 6pm - Pager - [phone]  After 6pm go to [email or web address] - password [email or web address]  [facility]  Office  [phone]  CC: Primary care physician; Patient, No Pcp Per  Note: This dictation was prepared with Dragon dictation along with smaller phrase technology. Any transcriptional errors that result from this process are unintentional.

## 2019-03-08 NOTE — ED Notes (Signed)
ED TO INPATIENT HANDOFF REPORT  ED Nurse Name and Phone #: gracie  S Name/Age/Gender Corey Baker 58 y.o. male Room/Bed: ED25A/ED25A  Code Status   Code Status: Not on file  Home/SNF/Other Home Patient oriented to: self, place, time and situation Is this baseline? Yes   Triage Complete: Triage complete  Chief Complaint Fall  Triage Note Pt to ED via EMS from home. Pt arrives intoxicated with abrasions to head and bilateral hands. Pt states he had 2 beers earlier and fell. Pt denies any pain. Pt moving all extremities, speech clear.   Allergies Not on File  Level of Care/Admitting Diagnosis ED Disposition    ED Disposition Condition Comment   Admit  [facility]ospital [facility] [[id number]]  Level of Care: Med-Surg [16]  Covid Evaluation: Asymptomatic Screening Protocol (No Symptoms)  Diagnosis: Alcoholic intoxication with complication [facility](HCC) [[id number]]) [[id number]]  Admitting Physician: Oralia ManisWILLIS, DAVID [[id number]]  Attending Physician: Oralia ManisWILLIS, DAVID [[id number]]  PT Class (Do Not Modify): Observation [104]  PT Acc Code (Do Not Modify): Observation [10022]       B Medical/Surgery History Past Medical History:  Diagnosis Date  . Alcohol abuse   . Depression with anxiety   . GERD (gastroesophageal reflux disease)   . HLD (hyperlipidemia)   . HTN (hypertension)    History reviewed. No pertinent surgical history.   A IV Location/Drains/Wounds Patient Lines/Drains/Airways Status   Active Line/Drains/Airways    Name:   Placement date:   Placement time:   Site:   Days:   Peripheral IV 03/07/19 Left Hand   03/07/19    2122    Hand   1          Intake/Output Last 24 hours  Intake/Output Summary (Last 24 hours) at 03/08/2019 0144 Last data filed at 03/08/2019 0014 Gross per 24 hour  Intake 1000 ml  Output -  Net 1000 ml    Labs/Imaging Results for orders placed or performed during the hospital encounter of 03/07/19 (from the past 48 hour(s))  CBC with  Differential     Status: Abnormal   Collection Time: 03/07/19  9:13 PM  Result Value Ref Range   WBC 8.5 4.0 - 10.5 K/uL   RBC 4.09 (L) 4.22 - 5.81 MIL/uL   Hemoglobin 13.9 13.0 - 17.0 g/dL   HCT 57.842.0 46.939.0 - 62.952.0 %   MCV 102.7 (H) 80.0 - 100.0 fL   MCH 34.0 26.0 - 34.0 pg   MCHC 33.1 30.0 - 36.0 g/dL   RDW 52.812.3 41.311.5 - 24.415.5 %   Platelets 231 150 - 400 K/uL   nRBC 0.0 0.0 - 0.2 %   Neutrophils Relative % 67 %   Neutro Abs 5.7 1.7 - 7.7 K/uL   Lymphocytes Relative 20 %   Lymphs Abs 1.7 0.7 - 4.0 K/uL   Monocytes Relative 11 %   Monocytes Absolute 1.0 0.1 - 1.0 K/uL   Eosinophils Relative 1 %   Eosinophils Absolute 0.1 0.0 - 0.5 K/uL   Basophils Relative 1 %   Basophils Absolute 0.1 0.0 - 0.1 K/uL   Immature Granulocytes 0 %   Abs Immature Granulocytes 0.03 0.00 - 0.07 K/uL    Comment: Performed at [facility], [address] [place], [place] [address]  Ethanol     Status: Abnormal   Collection Time: 03/07/19  9:13 PM  Result Value Ref Range   Alcohol, Ethyl (B) 368 (HH) <10 mg/dL    Comment: CRITICAL RESULT CALLED TO, READ BACK BY  AND VERIFIED WITH GRACIE [facility] ON 03/07/19 AT [address] (NOTE) Lowest detectable limit for serum alcohol is 10 mg/dL. For medical purposes only. Performed at [facility], [address] [place], [place] [address]   Comprehensive metabolic panel     Status: Abnormal   Collection Time: 03/07/19  9:13 PM  Result Value Ref Range   Sodium 146 (H) 135 - 145 mmol/L   Potassium 4.1 3.5 - 5.1 mmol/L   Chloride 112 (H) 98 - 111 mmol/L   CO2 23 22 - 32 mmol/L   Glucose, Bld 98 70 - 99 mg/dL   BUN 39 (H) 6 - 20 mg/dL   Creatinine, Ser 3.21 (H) 0.61 - 1.24 mg/dL   Calcium 8.8 (L) 8.9 - 10.3 mg/dL   Total Protein 7.0 6.5 - 8.1 g/dL   Albumin 4.0 3.5 - 5.0 g/dL   AST 34 15 - 41 U/L   ALT 37 0 - 44 U/L   Alkaline Phosphatase 43 38 - 126 U/L   Total Bilirubin 0.7 0.3 - 1.2 mg/dL   GFR calc non Af Amer 21 (L) >60 mL/min   GFR calc Af  Amer 24 (L) >60 mL/min   Anion gap 11 5 - 15    Comment: Performed at [facility], [address] [place], [place] [address]  Salicylate level     Status: None   Collection Time: 03/07/19  9:13 PM  Result Value Ref Range   Salicylate Lvl <0.7 2.8 - 30.0 mg/dL    Comment: Performed at [facility], [address], [place],  [address]  Urine Drug Screen, Qualitative ([facility] only)     Status: Abnormal   Collection Time: 03/07/19 10:29 PM  Result Value Ref Range   Tricyclic, Ur Screen POSITIVE (A) NONE DETECTED   Amphetamines, Ur Screen NONE DETECTED NONE DETECTED   MDMA (Ecstasy)Ur Screen NONE DETECTED NONE DETECTED   Cocaine Metabolite,Ur [place] NONE DETECTED NONE DETECTED   Opiate, Ur Screen POSITIVE (A) NONE DETECTED   Phencyclidine (PCP) Ur S NONE DETECTED NONE DETECTED   Cannabinoid 50 Ng, Ur [place] NONE DETECTED NONE DETECTED   Barbiturates, Ur Screen NONE DETECTED NONE DETECTED   Benzodiazepine, Ur Scrn POSITIVE (A) NONE DETECTED   Methadone Scn, Ur NONE DETECTED NONE DETECTED    Comment: (NOTE) Tricyclics + metabolites, urine    Cutoff 1000 ng/mL Amphetamines + metabolites, urine  Cutoff 1000 ng/mL MDMA (Ecstasy), urine              Cutoff 500 ng/mL Cocaine Metabolite, urine          Cutoff 300 ng/mL Opiate + metabolites, urine        Cutoff 300 ng/mL Phencyclidine (PCP), urine         Cutoff 25 ng/mL Cannabinoid, urine                 Cutoff 50 ng/mL Barbiturates + metabolites, urine  Cutoff 200 ng/mL Benzodiazepine, urine              Cutoff 200 ng/mL Methadone, urine                   Cutoff 300 ng/mL The urine drug screen provides only a preliminary, unconfirmed analytical test result and should not be used for non-medical purposes. Clinical consideration and professional judgment should be applied to any positive drug screen result due to possible interfering substances. A more specific alternate chemical method must be used in order to obtain  a  confirmed analytical result. Gas chromatography / mass spectrometry (GC/MS) is the preferred confirmat ory method. Performed at [facility], [address] [place], [place] [address]    Dg Chest 2 View  Result Date: 03/07/2019 CLINICAL DATA:  Shortness of breath EXAM: CHEST - 2 VIEW COMPARISON:  03/17/2018 FINDINGS: Low lung volumes with bibasilar atelectasis. Heart is normal size. No effusions. No acute bony abnormality. IMPRESSION: Low volumes, bibasilar atelectasis. Electronically Signed   By: Charlett NoseKevin  Dover M.D.   On: 03/07/2019 22:01   Dg Pelvis 1-2 Views  Result Date: 03/07/2019 CLINICAL DATA:  Shortness of breath, intoxicated, fall EXAM: PELVIS - 1-2 VIEW COMPARISON:  Pelvic radiograph 03/17/2018, CT 03/17/2018 FINDINGS: No acute pelvic fractures or diastasis. Remote posttraumatic deformity of the pubic root and inferior rami bilaterally. The femoral heads remain normally located. Proximal femora are intact. Mild degenerative changes in the lower lumbar spine and both hips. Enthesopathic changes noted on the iliac crests. IMPRESSION: No acute osseous abnormality. Electronically Signed   By: Kreg ShropshirePrice  DeHay M.D.   On: 03/07/2019 22:03   Ct Head Wo Contrast  Result Date: 03/07/2019 CLINICAL DATA:  58 year old male with altered mental status. EXAM: CT HEAD WITHOUT CONTRAST CT CERVICAL SPINE WITHOUT CONTRAST TECHNIQUE: Multidetector CT imaging of the head and cervical spine was performed following the standard protocol without intravenous contrast. Multiplanar CT image reconstructions of the cervical spine were also generated. COMPARISON:  Head CT dated 03/17/2018 FINDINGS: CT HEAD FINDINGS Brain: There is mild age-related atrophy and chronic microvascular ischemic changes. There is no acute intracranial hemorrhage. No mass effect or midline shift. No extra-axial fluid collection. Vascular: No hyperdense vessel or unexpected calcification. Skull: Normal. Negative for fracture or focal  lesion. Sinuses/Orbits: No acute finding. Other: None CT CERVICAL SPINE FINDINGS Alignment: No acute subluxation. There is straightening of normal cervical lordosis which may be positional or due to muscle spasm. Skull base and vertebrae: No acute fracture. No primary bone lesion or focal pathologic process. Soft tissues and spinal canal: No prevertebral fluid or swelling. No visible canal hematoma. Disc levels: No acute findings. Degenerative changes with disc space narrowing most prominent at C4-C5. Upper chest: Negative. Other: None IMPRESSION: 1. No acute intracranial hemorrhage. Mild age-related atrophy and chronic microvascular ischemic changes. 2. No acute/traumatic cervical spine pathology. Electronically Signed   By: Elgie CollardArash  Radparvar M.D.   On: 03/07/2019 21:57   Ct Cervical Spine Wo Contrast  Result Date: 03/07/2019 CLINICAL DATA:  58 year old male with altered mental status. EXAM: CT HEAD WITHOUT CONTRAST CT CERVICAL SPINE WITHOUT CONTRAST TECHNIQUE: Multidetector CT imaging of the head and cervical spine was performed following the standard protocol without intravenous contrast. Multiplanar CT image reconstructions of the cervical spine were also generated. COMPARISON:  Head CT dated 03/17/2018 FINDINGS: CT HEAD FINDINGS Brain: There is mild age-related atrophy and chronic microvascular ischemic changes. There is no acute intracranial hemorrhage. No mass effect or midline shift. No extra-axial fluid collection. Vascular: No hyperdense vessel or unexpected calcification. Skull: Normal. Negative for fracture or focal lesion. Sinuses/Orbits: No acute finding. Other: None CT CERVICAL SPINE FINDINGS Alignment: No acute subluxation. There is straightening of normal cervical lordosis which may be positional or due to muscle spasm. Skull base and vertebrae: No acute fracture. No primary bone lesion or focal pathologic process. Soft tissues and spinal canal: No prevertebral fluid or swelling. No visible canal  hematoma. Disc levels: No acute findings. Degenerative changes with disc space narrowing most prominent at C4-C5. Upper chest: Negative.  Other: None IMPRESSION: 1. No acute intracranial hemorrhage. Mild age-related atrophy and chronic microvascular ischemic changes. 2. No acute/traumatic cervical spine pathology. Electronically Signed   By: Elgie Collard M.D.   On: 03/07/2019 21:57    Pending Labs Unresulted Labs (From admission, onward)    Start     Ordered   03/07/19 2312  Novel Coronavirus, NAA (Hosp order, Send-out to Ref Lab; TAT 18-24 hrs  (Asymptomatic/Tier 2 Patients Labs)  ONCE - STAT,   STAT    Question Answer Comment  Is this test for diagnosis or screening Screening   Symptomatic for COVID-19 as defined by CDC No   Hospitalized for COVID-19 No   Admitted to ICU for COVID-19 No   Previously tested for COVID-19 No   Resident in a congregate (group) care setting No   Employed in healthcare setting No      03/07/19 2311   Signed and Held  HIV antibody (Routine Testing)  Once,   R     Signed and Held   Signed and Held  CBC  (heparin)  Once,   R    Comments: Baseline for heparin therapy IF NOT ALREADY DRAWN.  Notify MD if PLT < 100 K.    Signed and Held   Signed and Held  Creatinine, serum  (heparin)  Once,   R    Comments: Baseline for heparin therapy IF NOT ALREADY DRAWN.    Signed and Held   Signed and Held  Basic metabolic panel  Tomorrow morning,   R     Signed and Held   Signed and Held  CBC  Tomorrow morning,   R     Signed and Held          Vitals/Pain Today's Vitals   03/07/19 2112 03/07/19 2113 03/08/19 0044  BP:   139/88  Pulse: (!) 102    Resp: 16    Temp: 98.2 F (36.8 C)    TempSrc: Oral    SpO2: 99%    Weight:  81.6 kg   Height:  6' (1.829 m)   PainSc:  0-No pain     Isolation Precautions No active isolations  Medications Medications  sodium chloride 0.9 % bolus 1,000 mL (0 mLs Intravenous Stopped 03/08/19 0014)  0.9 %  sodium chloride  infusion ( Intravenous New Bag/Given 03/08/19 0021)  Tdap (BOOSTRIX) injection 0.5 mL (0.5 mLs Intramuscular Given 03/08/19 0021)    Mobility walks High fall risk   Focused Assessments Renal Assessment Handoff:  Hemodialysis Schedule:  Last Hemodialysis date and time:    Restricted appendage:    , Pulmonary Assessment Handoff:  Lung sounds:   O2 Device: Room Air        R Recommendations: See Admitting Provider Note  Report given to:   Additional Notes:

## 2019-03-08 NOTE — ED Notes (Signed)
Pt continuously standing and yelling across ED he needs a room or he will leave. Pt made aware it is in his best interest to stay. Pt standing up walking around, told to lay down due to instability walking, and IV hooked to fluids. Pt redirected to bed.

## 2019-03-09 LAB — NOVEL CORONAVIRUS, NAA (HOSP ORDER, SEND-OUT TO REF LAB; TAT 18-24 HRS): SARS-CoV-2, NAA: NOT DETECTED

## 2019-03-09 LAB — HIV ANTIBODY (ROUTINE TESTING W REFLEX): HIV Screen 4th Generation wRfx: NONREACTIVE

## 2019-05-15 ENCOUNTER — Other Ambulatory Visit: Payer: Self-pay

## 2019-05-15 ENCOUNTER — Telehealth: Payer: Self-pay | Admitting: Emergency Medicine

## 2019-05-15 ENCOUNTER — Emergency Department: Payer: BC Managed Care – PPO

## 2019-05-15 ENCOUNTER — Emergency Department
Admission: EM | Admit: 2019-05-15 | Discharge: 2019-05-15 | Disposition: A | Discharge status: Home / Self Care | Payer: BC Managed Care – PPO | Attending: Emergency Medicine | Admitting: Emergency Medicine

## 2019-05-15 ENCOUNTER — Encounter: Payer: Self-pay | Admitting: Emergency Medicine

## 2019-05-15 DIAGNOSIS — U071 COVID-19: Secondary | ICD-10-CM | POA: Diagnosis not present

## 2019-05-15 DIAGNOSIS — R519 Headache, unspecified: Secondary | ICD-10-CM | POA: Insufficient documentation

## 2019-05-15 DIAGNOSIS — I10 Essential (primary) hypertension: Secondary | ICD-10-CM | POA: Diagnosis not present

## 2019-05-15 DIAGNOSIS — R079 Chest pain, unspecified: Secondary | ICD-10-CM | POA: Insufficient documentation

## 2019-05-15 DIAGNOSIS — J069 Acute upper respiratory infection, unspecified: Secondary | ICD-10-CM | POA: Insufficient documentation

## 2019-05-15 DIAGNOSIS — R05 Cough: Secondary | ICD-10-CM | POA: Diagnosis present

## 2019-05-15 DIAGNOSIS — Z79899 Other long term (current) drug therapy: Secondary | ICD-10-CM | POA: Diagnosis not present

## 2019-05-15 LAB — CBC
HCT: 38.7 % — ABNORMAL LOW (ref 39.0–52.0)
Hemoglobin: 13 g/dL (ref 13.0–17.0)
MCH: 34.2 pg — ABNORMAL HIGH (ref 26.0–34.0)
MCHC: 33.6 g/dL (ref 30.0–36.0)
MCV: 101.8 fL — ABNORMAL HIGH (ref 80.0–100.0)
Platelets: 145 10*3/uL — ABNORMAL LOW (ref 150–400)
RBC: 3.8 MIL/uL — ABNORMAL LOW (ref 4.22–5.81)
RDW: 11.9 % (ref 11.5–15.5)
WBC: 3.7 10*3/uL — ABNORMAL LOW (ref 4.0–10.5)
nRBC: 0 % (ref 0.0–0.2)

## 2019-05-15 LAB — COMPREHENSIVE METABOLIC PANEL
ALT: 43 U/L (ref 0–44)
AST: 57 U/L — ABNORMAL HIGH (ref 15–41)
Albumin: 4.1 g/dL (ref 3.5–5.0)
Alkaline Phosphatase: 56 U/L (ref 38–126)
Anion gap: 11 (ref 5–15)
BUN: 21 mg/dL — ABNORMAL HIGH (ref 6–20)
CO2: 25 mmol/L (ref 22–32)
Calcium: 9 mg/dL (ref 8.9–10.3)
Chloride: 103 mmol/L (ref 98–111)
Creatinine, Ser: 1.42 mg/dL — ABNORMAL HIGH (ref 0.61–1.24)
GFR calc Af Amer: >60 mL/min (ref >60)
GFR calc non Af Amer: 55 mL/min — ABNORMAL LOW (ref >60)
Glucose, Bld: 93 mg/dL (ref 70–99)
Potassium: 3.8 mmol/L (ref 3.5–5.1)
Sodium: 139 mmol/L (ref 135–145)
Total Bilirubin: 1.1 mg/dL (ref 0.3–1.2)
Total Protein: 7.2 g/dL (ref 6.5–8.1)

## 2019-05-15 LAB — URINALYSIS, COMPLETE (UACMP) WITH MICROSCOPIC
Bacteria, UA: NONE SEEN
Bilirubin Urine: NEGATIVE
Glucose, UA: NEGATIVE mg/dL
Hgb urine dipstick: NEGATIVE
Ketones, ur: NEGATIVE mg/dL
Leukocytes,Ua: NEGATIVE
Nitrite: NEGATIVE
Protein, ur: 30 mg/dL — AB
Specific Gravity, Urine: 1.026 (ref 1.005–1.030)
Squamous Epithelial / HPF: NONE SEEN (ref 0–5)
pH: 5 (ref 5.0–8.0)

## 2019-05-15 LAB — TROPONIN I (HIGH SENSITIVITY): Troponin I (High Sensitivity): 11 ng/L (ref <18)

## 2019-05-15 LAB — SARS CORONAVIRUS 2 (TAT 6-24 HRS): SARS Coronavirus 2: POSITIVE — AB

## 2019-05-15 MED ORDER — AZITHROMYCIN 250 MG PO TABS: ORAL_TABLET | ORAL | 0 refills | Status: AC

## 2019-05-15 MED ORDER — IBUPROFEN 600 MG PO TABS: 600.0000 mg | ORAL_TABLET | Freq: Three times a day (TID) | ORAL | 0 refills | Status: DC | PRN

## 2019-05-15 MED ORDER — PSEUDOEPH-BROMPHEN-DM 30-2-10 MG/5ML PO SYRP: 5.0000 mL | ORAL_SOLUTION | Freq: Four times a day (QID) | ORAL | 0 refills | Status: AC | PRN

## 2019-05-15 NOTE — ED Notes (Signed)
See triage note  States he developed runny nose cough and subjective fever   Sx's started couple of days ago

## 2019-05-15 NOTE — Telephone Encounter (Signed)
Called patients wife and then spoke to patient about his result as well.  Explained isolation guidelines and need to inform any contacts.  Advised to do  evisit or phone visit with pcp for mild to moderate symptoms and to return to ED for more concerning symptoms.

## 2019-05-15 NOTE — ED Provider Notes (Signed)
[facility] Emergency Department Provider Note   ____________________________________________   First MD Initiated Contact with Patient 05/15/19 [phone]     (approximate)  I have reviewed the triage vital signs and the nursing notes.   HISTORY  Chief Complaint Cough, Nasal Congestion, Headache, Chest Pain, and Generalized Body Aches    HPI Corey Baker is a 59 y.o. male patient presents with nonproductive cough, fatigue, burning sensation in the center of chest, and runny nose.  Patient states he just returned from [facility].  Unknown positive COVID-19 exposure.  Wife is here with similar complaints.      Past Medical History:  Diagnosis Date  . Alcohol abuse   . Depression with anxiety   . GERD (gastroesophageal reflux disease)   . HLD (hyperlipidemia)   . HTN (hypertension)     Patient Active Problem List   Diagnosis Date Noted  . HTN (hypertension) 03/07/2019  . HLD (hyperlipidemia) 03/07/2019  . GERD (gastroesophageal reflux disease) 03/07/2019  . Alcoholic intoxication with complication ([place]) 95/03/3266    Past Surgical History:  Procedure Laterality Date  . BACK SURGERY      Prior to Admission medications   Medication Sig Start Date End Date Taking? Authorizing Provider  atorvastatin (LIPITOR) 20 MG tablet Take 20 mg by mouth daily. 06/24/18  Yes [provider]  cyclobenzaprine (FLEXERIL) 10 MG tablet Take 10 mg by mouth 3 (three) times daily.   Yes [provider]  diclofenac sodium (VOLTAREN) 1 % GEL Apply topically 4 (four) times daily.   Yes [provider]  DULoxetine (CYMBALTA) 30 MG capsule Take 30 mg by mouth daily.   Yes [provider]  olmesartan-hydrochlorothiazide (BENICAR HCT) 20-12.5 MG tablet Take 1 tablet by mouth daily.   Yes [provider]  pantoprazole (PROTONIX) 40 MG tablet Take 40 mg by mouth at bedtime. 11/12/18  Yes [provider]  tadalafil  (CIALIS) 5 MG tablet Take 5 mg by mouth daily.   Yes [provider]  zolpidem (AMBIEN CR) 12.5 MG CR tablet Take 12.5 mg by mouth at bedtime as needed for sleep.   Yes [provider]  azithromycin (ZITHROMAX Z-PAK) 250 MG tablet Take 2 tablets (500 mg) on  Day 1,  followed by 1 tablet (250 mg) once daily on Days 2 through 5. 05/15/19 05/20/19  Sable Feil, PA-C  brompheniramine-pseudoephedrine-DM 30-2-10 MG/5ML syrup Take 5 mLs by mouth 4 (four) times daily as needed. 05/15/19   Sable Feil, PA-C  ibuprofen (ADVIL) 600 MG tablet Take 1 tablet (600 mg total) by mouth every 8 (eight) hours as needed. 05/15/19   Sable Feil, PA-C    Allergies Patient has no known allergies.  History reviewed. No pertinent family history.  Social History Social History   Tobacco Use  . Smoking status: Never Smoker  . Smokeless tobacco: Never Used  Substance Use Topics  . Alcohol use: Yes  . Drug use: Never    Review of Systems  Constitutional: Low-grade fever, fatigue, and body aches. Eyes: No visual changes. ENT: No sore throat. Cardiovascular: Denies chest pain. Respiratory: Denies shortness of breath. Gastrointestinal: No abdominal pain.  No nausea, no vomiting.  No diarrhea.  No constipation. Genitourinary: Negative for dysuria. Musculoskeletal: Negative for back pain. Skin: Negative for rash. Neurological: Negative for headaches, focal weakness or numbness. Psychiatric:  Alcohol use and depression. Endocrine:  Hyperlipidemia and hypertension.  ____________________________________________   PHYSICAL EXAM:  VITAL SIGNS: ED Triage Vitals  Enc Vitals Group     BP 05/15/19 0519 (!) 127/95     Pulse Rate 05/15/19 0519 (!) 114     Resp --      Temp 05/15/19 0519 99 F (37.2 C)     Temp Source 05/15/19 0519 Oral     SpO2 05/15/19 0519 98 %     Weight 05/15/19 0519 200 lb (90.7 kg)     Height 05/15/19 0519 6\' 1"  (1.854 m)     Head Circumference --      Peak  Flow --      Pain Score 05/15/19 0524 0     Pain Loc --      Pain Edu? --      Excl. in GC? --    Constitutional: Alert and oriented. Well appearing and in no acute distress. Mouth/Throat: Mucous membranes are moist.  Oropharynx non-erythematous. Neck: No stridor.   Cardiovascular: Tachycardic, regular rhythm. Grossly normal heart sounds.  Good peripheral circulation. Respiratory: Normal respiratory effort.  No retractions. Lungs CTAB. Gastrointestinal: Soft and nontender. No distention. No abdominal bruits. No CVA tenderness. Musculoskeletal: No lower extremity tenderness nor edema.  No joint effusions. Neurologic:  Normal speech and language. No gross focal neurologic deficits are appreciated. No gait instability. Skin:  Skin is warm, dry and intact. No rash noted. Psychiatric: Mood and affect are normal. Speech and behavior are normal.  ____________________________________________   LABS (all labs ordered are listed, but only abnormal results are displayed)  Labs Reviewed  CBC - Abnormal; Notable for the following components:      Result Value   WBC 3.7 (*)    RBC 3.80 (*)    HCT 38.7 (*)    MCV 101.8 (*)    MCH 34.2 (*)    Platelets 145 (*)    All other components within normal limits  COMPREHENSIVE METABOLIC PANEL - Abnormal; Notable for the following components:   BUN 21 (*)    Creatinine, Ser 1.42 (*)    AST 57 (*)    GFR calc non Af Amer 55 (*)    All other components within normal limits  URINALYSIS, COMPLETE (UACMP) WITH MICROSCOPIC - Abnormal; Notable for the following components:   Color, Urine YELLOW (*)    APPearance CLEAR (*)    Protein, ur 30 (*)    All other components within normal limits  SARS CORONAVIRUS 2 (TAT 6-24 HRS)  TROPONIN I (HIGH SENSITIVITY)  TROPONIN I (HIGH SENSITIVITY)   ____________________________________________  EKG   ____________________________________________  RADIOLOGY  ED MD interpretation:    Official radiology  report(s): Dg Chest 2 View  Result Date: 05/15/2019 CLINICAL DATA:  Chest EXAM: CHEST - 2 VIEW COMPARISON:  03/07/1999 FINDINGS: Heart size is stable. There are multiple old healed displaced right-sided rib fractures. No evidence for pneumothorax. There is scarring versus atelectasis at the lung bases, right worse than left. IMPRESSION: Streaky bibasilar airspace opacities favored to represent atelectasis, however an infiltrate is not entirely excluded. Multiple old healed displaced right-sided rib fractures are again noted. Electronically Signed   By: Katherine Mantlehristopher  Green M.D.   On: 05/15/2019 05:48    ____________________________________________   PROCEDURES  Procedure(s) performed (including Critical Care):  Procedures   ____________________________________________   INITIAL IMPRESSION / ASSESSMENT AND PLAN / ED COURSE  As part of my medical decision making, I reviewed the following data within the electronic MEDICAL RECORD NUMBER         Corey Baker was evaluated in Emergency Department on  05/15/2019 for the symptoms described in the history of present illness. He was evaluated in the context of the global COVID-19 pandemic, which necessitated consideration that the patient might be at risk for infection with the SARS-CoV-2 virus that causes COVID-19. Institutional protocols and algorithms that pertain to the evaluation of patients at risk for COVID-19 are in a state of rapid change based on information released by regulatory bodies including the CDC and federal and state organizations. These policies and algorithms were followed during the patient's care in the ED.  Patient presents with viral symptoms consisting of runny nose, headache, cough, and fatigue.  Discussed x-ray findings with patient suggestive of early infiltrate.  Patient advised self quarantine pending results of Covid test.  Patient advised take medication as directed.     ____________________________________________    FINAL CLINICAL IMPRESSION(S) / ED DIAGNOSES  Final diagnoses:  Upper respiratory tract infection, unspecified type     ED Discharge Orders         Ordered    azithromycin (ZITHROMAX Z-PAK) 250 MG tablet     05/15/19 0835    brompheniramine-pseudoephedrine-DM 30-2-10 MG/5ML syrup  4 times daily PRN     05/15/19 0835    ibuprofen (ADVIL) 600 MG tablet  Every 8 hours PRN     05/15/19 0835           Note:  This document was prepared using Dragon voice recognition software and may include unintentional dictation errors.    Joni Reining, PA-C 05/15/19 7209    Emily Filbert, MD 05/15/19 [phone]

## 2019-05-15 NOTE — Discharge Instructions (Addendum)
Advised self quarantine pending results of COVID-19 test.  If positive he was quarantine for 10 days.

## 2019-05-15 NOTE — ED Triage Notes (Addendum)
Pt is here with wife who also checked in for similar symptom; pt says his symptoms started yesterday-runny nose, headache, cough and burning pain to the center of his chest (no chest pain at present); they returned yesterday from [facility]; and a co-worker of his wife has been coughing at work for some time now; unknown COVID exposure; pt adds aching all over

## 2019-05-19 ENCOUNTER — Other Ambulatory Visit: Payer: Self-pay | Admitting: Specialist

## 2019-05-24 ENCOUNTER — Other Ambulatory Visit: Admission: RE | Admit: 2019-05-24 | Payer: BC Managed Care – PPO | Source: Ambulatory Visit

## 2019-05-25 ENCOUNTER — Other Ambulatory Visit: Payer: BC Managed Care – PPO

## 2019-06-06 ENCOUNTER — Other Ambulatory Visit: Payer: Self-pay

## 2019-06-06 ENCOUNTER — Encounter
Admission: RE | Admit: 2019-06-06 | Discharge: 2019-06-06 | Disposition: A | Discharge status: Home / Self Care | Payer: No Typology Code available for payment source | Source: Ambulatory Visit | Attending: Specialist | Admitting: Specialist

## 2019-06-06 DIAGNOSIS — Z01812 Encounter for preprocedural laboratory examination: Secondary | ICD-10-CM | POA: Diagnosis not present

## 2019-06-06 DIAGNOSIS — S83232D Complex tear of medial meniscus, current injury, left knee, subsequent encounter: Secondary | ICD-10-CM | POA: Diagnosis not present

## 2019-06-06 LAB — CBC
HCT: 40.6 % (ref 39.0–52.0)
Hemoglobin: 14 g/dL (ref 13.0–17.0)
MCH: 32.9 pg (ref 26.0–34.0)
MCHC: 34.5 g/dL (ref 30.0–36.0)
MCV: 95.5 fL (ref 80.0–100.0)
Platelets: 237 10*3/uL (ref 150–400)
RBC: 4.25 MIL/uL (ref 4.22–5.81)
RDW: 11.9 % (ref 11.5–15.5)
WBC: 8.1 10*3/uL (ref 4.0–10.5)
nRBC: 0 % (ref 0.0–0.2)

## 2019-06-06 LAB — BASIC METABOLIC PANEL
Anion gap: 13 (ref 5–15)
BUN: 29 mg/dL — ABNORMAL HIGH (ref 6–20)
CO2: 24 mmol/L (ref 22–32)
Calcium: 9.3 mg/dL (ref 8.9–10.3)
Chloride: 105 mmol/L (ref 98–111)
Creatinine, Ser: 0.95 mg/dL (ref 0.61–1.24)
GFR calc Af Amer: >60 mL/min (ref >60)
GFR calc non Af Amer: >60 mL/min (ref >60)
Glucose, Bld: 105 mg/dL — ABNORMAL HIGH (ref 70–99)
Potassium: 3.9 mmol/L (ref 3.5–5.1)
Sodium: 142 mmol/L (ref 135–145)

## 2019-06-06 NOTE — Patient Instructions (Signed)
Your procedure is scheduled on:  Monday 06/12/19.  Report to DAY SURGERY DEPARTMENT LOCATED ON 2ND FLOOR MEDICAL MALL ENTRANCE. To find out your arrival time please call [phone] between 1PM - 3PM on Friday 06/09/19.   Remember: Instructions that are not followed completely may result in serious medical risk, up to and including death, or upon the discretion of your surgeon and anesthesiologist your surgery may need to be rescheduled.      _X__ 1. Do not eat food after midnight the night before your procedure.                 No gum chewing or hard candies. You may drink clear liquids up to 2 hours                 before you are scheduled to arrive for your surgery- DO NOT drink clear                 liquids within 2 hours of the start of your surgery.                 Clear Liquids include:  water, apple juice without pulp, clear carbohydrate                 drink such as Clearfast or Gatorade, Black Coffee or Tea (Do not add                 milk or creamer to coffee or tea).    __X__2.  On the morning of surgery brush your teeth with toothpaste and water, you may rinse your mouth with mouthwash if you wish.  Do not swallow any toothpaste or mouthwash.       _X__ 3.  No Alcohol for 24 hours before or after surgery.    __X__4.  Notify your doctor if there is any change in your medical condition      (cold, fever, infections).       Do not wear jewelry, make-up, hairpins, clips or nail polish. Do not wear lotions, powders, or perfumes.  Do not shave 48 hours prior to surgery. Men may shave face and neck. Do not bring valuables to the hospital.      [facility] is not responsible for any belongings or valuables.    Contacts, dentures/partials or body piercings may not be worn into surgery. Bring a case for your contacts, glasses or hearing aids, a denture cup will be supplied.     Patients discharged the day of surgery will not be allowed to drive  home.     Please read over the following fact sheets that you were given:   MRSA Information    __X__ Take these medicines the morning of surgery with A SIP OF WATER:     1. atorvastatin (LIPITOR) 20 MG tablet  2. DULoxetine (CYMBALTA) 30 MG capsule  3. pantoprazole (PROTONIX) 40 MG tablet  4. cyclobenzaprine (FLEXERIL) 10 MG tablet if needed     __X__ Use CHG Soap as directed    __X__ Stop Anti-inflammatories 7 days before surgery such as Advil, Ibuprofen, Motrin, BC or Goodies Powder, Naprosyn, Naproxen, Aleve, Aspirin, Meloxicam. May take Tylenol if needed for pain or discomfort.     __X__ Don't start taking any new herbal supplements.

## 2019-06-11 MED ORDER — CLINDAMYCIN PHOSPHATE 600 MG/50ML IV SOLN
600.0000 mg | INTRAVENOUS | Status: AC
  Administered 2019-06-12: 600 mg via INTRAVENOUS

## 2019-06-12 ENCOUNTER — Ambulatory Visit
Admission: RE | Admit: 2019-06-12 | Discharge: 2019-06-12 | Disposition: A | Discharge status: Home / Self Care | Payer: No Typology Code available for payment source | Attending: Specialist | Admitting: Specialist

## 2019-06-12 ENCOUNTER — Ambulatory Visit: Payer: No Typology Code available for payment source | Admitting: Certified Registered Nurse Anesthetist

## 2019-06-12 ENCOUNTER — Encounter: Payer: Self-pay | Admitting: *Deleted

## 2019-06-12 ENCOUNTER — Other Ambulatory Visit: Payer: Self-pay

## 2019-06-12 ENCOUNTER — Encounter
Admission: RE | Disposition: A | Discharge status: Home / Self Care | Payer: Self-pay | Source: Home / Self Care | Attending: Specialist

## 2019-06-12 DIAGNOSIS — I10 Essential (primary) hypertension: Secondary | ICD-10-CM | POA: Insufficient documentation

## 2019-06-12 DIAGNOSIS — X58XXXA Exposure to other specified factors, initial encounter: Secondary | ICD-10-CM | POA: Diagnosis not present

## 2019-06-12 DIAGNOSIS — S83242A Other tear of medial meniscus, current injury, left knee, initial encounter: Secondary | ICD-10-CM | POA: Diagnosis present

## 2019-06-12 DIAGNOSIS — Z888 Allergy status to other drugs, medicaments and biological substances status: Secondary | ICD-10-CM | POA: Insufficient documentation

## 2019-06-12 HISTORY — PX: KNEE ARTHROSCOPY: SHX127

## 2019-06-12 SURGERY — ARTHROSCOPY, KNEE
Anesthesia: General | Site: Knee | Laterality: Left

## 2019-06-12 MED ORDER — BUPIVACAINE HCL (PF) 0.5 % IJ SOLN
INTRAMUSCULAR | Status: AC
  Filled 2019-06-12: qty 30

## 2019-06-12 MED ORDER — MELOXICAM 7.5 MG PO TABS
15.0000 mg | ORAL_TABLET | ORAL | Status: AC
  Administered 2019-06-12: 07:00:00 15 mg via ORAL

## 2019-06-12 MED ORDER — FENTANYL CITRATE (PF) 100 MCG/2ML IJ SOLN
INTRAMUSCULAR | Status: DC | PRN
  Administered 2019-06-12 (×2): 50 ug via INTRAVENOUS

## 2019-06-12 MED ORDER — ACETAMINOPHEN 10 MG/ML IV SOLN
INTRAVENOUS | Status: DC | PRN
  Administered 2019-06-12: 1000 mg via INTRAVENOUS

## 2019-06-12 MED ORDER — ACETAMINOPHEN 10 MG/ML IV SOLN
INTRAVENOUS | Status: AC
  Filled 2019-06-12: qty 100

## 2019-06-12 MED ORDER — LIDOCAINE HCL (CARDIAC) PF 100 MG/5ML IV SOSY
PREFILLED_SYRINGE | INTRAVENOUS | Status: DC | PRN
  Administered 2019-06-12: 100 mg via INTRAVENOUS

## 2019-06-12 MED ORDER — LACTATED RINGERS IV SOLN
INTRAVENOUS | Status: DC
  Administered 2019-06-12: 07:00:00 via INTRAVENOUS

## 2019-06-12 MED ORDER — ONDANSETRON HCL 4 MG/2ML IJ SOLN
INTRAMUSCULAR | Status: DC | PRN
  Administered 2019-06-12: 4 mg via INTRAVENOUS

## 2019-06-12 MED ORDER — EPINEPHRINE PF 1 MG/ML IJ SOLN
INTRAMUSCULAR | Status: AC
  Filled 2019-06-12: qty 1

## 2019-06-12 MED ORDER — CHLORHEXIDINE GLUCONATE CLOTH 2 % EX PADS: 6.0000 | MEDICATED_PAD | Freq: Once | CUTANEOUS | Status: DC

## 2019-06-12 MED ORDER — CLINDAMYCIN PHOSPHATE 600 MG/50ML IV SOLN
INTRAVENOUS | Status: AC
  Filled 2019-06-12: qty 50

## 2019-06-12 MED ORDER — ROCURONIUM BROMIDE 100 MG/10ML IV SOLN
INTRAVENOUS | Status: DC | PRN
  Administered 2019-06-12: 25 mg via INTRAVENOUS
  Administered 2019-06-12: 5 mg via INTRAVENOUS
  Administered 2019-06-12: 20 mg via INTRAVENOUS

## 2019-06-12 MED ORDER — LIDOCAINE HCL (PF) 2 % IJ SOLN
INTRAMUSCULAR | Status: AC
  Filled 2019-06-12: qty 10

## 2019-06-12 MED ORDER — GABAPENTIN 300 MG PO CAPS
300.0000 mg | ORAL_CAPSULE | ORAL | Status: AC
  Administered 2019-06-12: 07:00:00 300 mg via ORAL

## 2019-06-12 MED ORDER — MIDAZOLAM HCL 2 MG/2ML IJ SOLN
INTRAMUSCULAR | Status: DC | PRN
  Administered 2019-06-12: 2 mg via INTRAVENOUS

## 2019-06-12 MED ORDER — GABAPENTIN 400 MG PO CAPS: 400.0000 mg | ORAL_CAPSULE | Freq: Three times a day (TID) | ORAL | 3 refills | Status: DC

## 2019-06-12 MED ORDER — HYDROCODONE-ACETAMINOPHEN 5-325 MG PO TABS: 1.0000 | ORAL_TABLET | Freq: Four times a day (QID) | ORAL | 0 refills | Status: DC | PRN

## 2019-06-12 MED ORDER — PHENYLEPHRINE HCL (PRESSORS) 10 MG/ML IV SOLN
INTRAVENOUS | Status: DC | PRN
  Administered 2019-06-12 (×3): 100 ug via INTRAVENOUS
  Administered 2019-06-12: 50 ug via INTRAVENOUS
  Administered 2019-06-12: 100 ug via INTRAVENOUS
  Administered 2019-06-12: 50 ug via INTRAVENOUS

## 2019-06-12 MED ORDER — DEXAMETHASONE SODIUM PHOSPHATE 10 MG/ML IJ SOLN
INTRAMUSCULAR | Status: AC
  Filled 2019-06-12: qty 1

## 2019-06-12 MED ORDER — MIDAZOLAM HCL 2 MG/2ML IJ SOLN
INTRAMUSCULAR | Status: AC
  Filled 2019-06-12: qty 2

## 2019-06-12 MED ORDER — PROPOFOL 10 MG/ML IV BOLUS
INTRAVENOUS | Status: DC | PRN
  Administered 2019-06-12: 200 mg via INTRAVENOUS

## 2019-06-12 MED ORDER — MORPHINE SULFATE (PF) 4 MG/ML IV SOLN
INTRAVENOUS | Status: AC
  Filled 2019-06-12: qty 1

## 2019-06-12 MED ORDER — BUPIVACAINE-EPINEPHRINE (PF) 0.5% -1:200000 IJ SOLN
INTRAMUSCULAR | Status: DC | PRN
  Administered 2019-06-12: 20 mL

## 2019-06-12 MED ORDER — SUCCINYLCHOLINE CHLORIDE 20 MG/ML IJ SOLN
INTRAMUSCULAR | Status: AC
  Filled 2019-06-12: qty 1

## 2019-06-12 MED ORDER — SUCCINYLCHOLINE CHLORIDE 20 MG/ML IJ SOLN
INTRAMUSCULAR | Status: DC | PRN
  Administered 2019-06-12: 100 mg via INTRAVENOUS

## 2019-06-12 MED ORDER — GABAPENTIN 300 MG PO CAPS
ORAL_CAPSULE | ORAL | Status: AC
  Administered 2019-06-12: 300 mg via ORAL
  Filled 2019-06-12: qty 1

## 2019-06-12 MED ORDER — CEFAZOLIN SODIUM-DEXTROSE 2-4 GM/100ML-% IV SOLN
INTRAVENOUS | Status: AC
  Filled 2019-06-12: qty 100

## 2019-06-12 MED ORDER — FENTANYL CITRATE (PF) 100 MCG/2ML IJ SOLN
INTRAMUSCULAR | Status: AC
  Filled 2019-06-12: qty 2

## 2019-06-12 MED ORDER — SUGAMMADEX SODIUM 200 MG/2ML IV SOLN
INTRAVENOUS | Status: DC | PRN
  Administered 2019-06-12: 196 mg via INTRAVENOUS

## 2019-06-12 MED ORDER — GLYCOPYRROLATE 0.2 MG/ML IJ SOLN
INTRAMUSCULAR | Status: DC | PRN
  Administered 2019-06-12: 0.2 mg via INTRAVENOUS

## 2019-06-12 MED ORDER — SUGAMMADEX SODIUM 200 MG/2ML IV SOLN
INTRAVENOUS | Status: AC
  Filled 2019-06-12: qty 2

## 2019-06-12 MED ORDER — MELOXICAM 7.5 MG PO TABS
ORAL_TABLET | ORAL | Status: AC
  Administered 2019-06-12: 15 mg via ORAL
  Filled 2019-06-12: qty 2

## 2019-06-12 MED ORDER — DEXAMETHASONE SODIUM PHOSPHATE 10 MG/ML IJ SOLN
INTRAMUSCULAR | Status: DC | PRN
  Administered 2019-06-12: 10 mg via INTRAVENOUS

## 2019-06-12 MED ORDER — CEFAZOLIN SODIUM-DEXTROSE 2-4 GM/100ML-% IV SOLN
2.0000 g | INTRAVENOUS | Status: AC
  Administered 2019-06-12: 11:00:00 2 g via INTRAVENOUS

## 2019-06-12 MED ORDER — PROPOFOL 10 MG/ML IV BOLUS
INTRAVENOUS | Status: AC
  Filled 2019-06-12: qty 40

## 2019-06-12 MED ORDER — ONDANSETRON HCL 4 MG/2ML IJ SOLN: 4.0000 mg | Freq: Once | INTRAMUSCULAR | Status: DC | PRN

## 2019-06-12 MED ORDER — ONDANSETRON HCL 4 MG/2ML IJ SOLN
INTRAMUSCULAR | Status: AC
  Filled 2019-06-12: qty 2

## 2019-06-12 MED ORDER — FENTANYL CITRATE (PF) 100 MCG/2ML IJ SOLN: 25.0000 ug | INTRAMUSCULAR | Status: DC | PRN

## 2019-06-12 SURGICAL SUPPLY — 32 items
ADAPTER IRRIG TUBE 2 SPIKE SOL (ADAPTER) ×4 IMPLANT
BLADE AGGRESSIVE PLUS 4.0 (BLADE) ×2 IMPLANT
BUR RADIUS 4.0X18.5 (BURR) IMPLANT
CHLORAPREP W/TINT 26 (MISCELLANEOUS) ×2 IMPLANT
COVER WAND RF STERILE (DRAPES) ×2 IMPLANT
CUFF TOURN SGL QUICK 24 (TOURNIQUET CUFF)
CUFF TOURN SGL QUICK 30 (TOURNIQUET CUFF)
CUFF TRNQT CYL 24X4X16.5-23 (TOURNIQUET CUFF) IMPLANT
CUFF TRNQT CYL 30X4X21-28X (TOURNIQUET CUFF) IMPLANT
CUTTER SLOTTED WHISKER 4.0 (BURR) IMPLANT
GAUZE SPONGE 4X4 12PLY STRL (GAUZE/BANDAGES/DRESSINGS) ×4 IMPLANT
GLOVE BIO SURGEON STRL SZ8 (GLOVE) ×2 IMPLANT
GOWN STRL REUS W/ TWL LRG LVL3 (GOWN DISPOSABLE) ×2 IMPLANT
GOWN STRL REUS W/TWL LRG LVL3 (GOWN DISPOSABLE) ×2
GOWN STRL REUS W/TWL LRG LVL4 (GOWN DISPOSABLE) ×2 IMPLANT
IV LACTATED RINGER IRRG 3000ML (IV SOLUTION) ×6
IV LR IRRIG 3000ML ARTHROMATIC (IV SOLUTION) ×6 IMPLANT
KIT TURNOVER KIT A (KITS) ×2 IMPLANT
MANIFOLD NEPTUNE II (INSTRUMENTS) ×2 IMPLANT
MAT ABSORB  FLUID 56X50 GRAY (MISCELLANEOUS) ×1
MAT ABSORB FLUID 56X50 GRAY (MISCELLANEOUS) ×1 IMPLANT
NDL SAFETY ECLIPSE 18X1.5 (NEEDLE) ×2 IMPLANT
NEEDLE HYPO 18GX1.5 SHARP (NEEDLE) ×2
PACK ARTHROSCOPY KNEE (MISCELLANEOUS) ×2 IMPLANT
SET TUBE SUCT SHAVER OUTFL 24K (TUBING) ×2 IMPLANT
SOL PREP PVP 2OZ (MISCELLANEOUS) ×2
SOLUTION PREP PVP 2OZ (MISCELLANEOUS) ×1 IMPLANT
STOCKINETTE BIAS CUT 6 980064 (GAUZE/BANDAGES/DRESSINGS) ×2 IMPLANT
SUT ETHILON 3 0 FSLX (SUTURE) ×2 IMPLANT
SYR 30ML LL (SYRINGE) ×4 IMPLANT
TUBING ARTHRO INFLOW-ONLY STRL (TUBING) ×2 IMPLANT
WAND COBLATION FLOW 50 (SURGICAL WAND) ×2 IMPLANT

## 2019-06-12 NOTE — OR Nursing (Signed)
Weight bearing status "toe touch, like walking on egg shells" per dr. Sabra Heck via tele 13:18.
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
## 2019-06-12 NOTE — Discharge Instructions (Addendum)
AMBULATORY SURGERY  DISCHARGE INSTRUCTIONS   1) The drugs that you were given will stay in your system until tomorrow so for the next 24 hours you should not:  A) Drive an automobile B) Make any legal decisions C) Drink any alcoholic beverage   2) You may resume regular meals tomorrow.  Today it is better to start with liquids and gradually work up to solid foods.  You may eat anything you prefer, but it is better to start with liquids, then soup and crackers, and gradually work up to solid foods.   3) Please notify your doctor immediately if you have any unusual bleeding, trouble breathing, redness and pain at the surgery site, drainage, fever, or pain not relieved by medication.    4) Additional Instructions:   FOLLOW DR. MILLER'S INSTRUCTIONS TOE TOUCH WEIGHT BEARING STATUS TO YOUR OPERATIVE LEG, "LIKE WALKING ON EGGSHELLS", USING YOUR WALKER.        Please contact your physician with any problems or Same Day Surgery at [phone], Monday through Friday 6 am to 4 pm, or [place] at [facility] number at [phone].

## 2019-06-12 NOTE — Op Note (Signed)
06/12/2019  12:11 PM  PATIENT:  Corey Baker    PRE-OPERATIVE DIAGNOSIS:  O14.103U complex tear or medial meniscus current injury left knee  POST-OPERATIVE DIAGNOSIS:  Same  PROCEDURE: Arthroscopic left medial meniscectomy, partial synovectomy  SURGEON:  Park Breed, MD  COMPLICATIONS:   None  EBL: None  TOURNIQUET TIME:   None  ANESTHESIA: General LMA  PREOPERATIVE INDICATIONS:  Corey Baker is a  58 y.o. male with a diagnosis of S83.232D complex tear or medial meniscus current injury left knee who failed conservative measures and elected for surgical management.    The risks benefits and alternatives were discussed with the patient preoperatively including but not limited to the risks of infection, bleeding, nerve injury, cardiopulmonary complications, the need for revision surgery, among others, and the patient was willing to proceed.  OPERATIVE IMPLANTS: None  OPERATIVE FINDINGS: There was a large free flap of the posterior medial meniscus torn.  This would move in and out of the joint easily.  The articular surfaces were intact medially and laterally.  Lateral meniscus was intact other than minimal fraying.  The anterior cruciate and posterior cruciate ligaments were intact.  The patellofemoral joint had very minimal fraying.  There was moderate synovitis and prominent fat pad.  There were no loose bodies.  The suprapatellar pouch was clear.  OPERATIVE PROCEDURE: The patient was brought to the operating room and underwent satisfactory general LMA anesthesia in the supine position.  The leg was prepped and draped in a sterile fashion.  Arthroscopy was carried out through standard portals.  The above findings were encountered upon arthroscopy.  The large flap of the medial meniscus was debrided with the ArthroCare wand and motorized resector.  A stable rim was created posteriorly.  Partial synovectomy was done for visualization and to remove fat pad from the patellofemoral joint.   There was no other significant pathology.   Once this was completed and stabilized the joint was thoroughly irrigated.  Instruments were removed and knee wounds closed with 3-0 nylon.  Sponge and needle counts were correct. A dry sterile dressing was applied.  Patient was awakened and taken to recovery in good condition.   Park Breed, MD
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
## 2019-06-12 NOTE — Anesthesia Post-op Follow-up Note (Signed)
Anesthesia QCDR form completed.        

## 2019-06-12 NOTE — H&P (Signed)
THE PATIENT WAS SEEN PRIOR TO SURGERY TODAY.  HISTORY, ALLERGIES, HOME MEDICATIONS AND OPERATIVE PROCEDURE WERE REVIEWED. RISKS AND BENEFITS OF SURGERY DISCUSSED WITH PATIENT AGAIN.  NO CHANGES FROM INITIAL HISTORY AND PHYSICAL NOTED.    

## 2019-06-12 NOTE — Transfer of Care (Signed)
Immediate Anesthesia Transfer of Care Note  Patient: Corey Baker  Procedure(s) Performed: ARTHROSCOPY KNEE (Left Knee)  Patient Location: PACU  Anesthesia Type:General  Level of Consciousness: awake, alert  and oriented  Airway & Oxygen Therapy: Patient Spontanous Breathing and Patient connected to face mask oxygen  Post-op Assessment: Report given to RN and Post -op Vital signs reviewed and stable  Post vital signs: Reviewed and stable  Last Vitals:  Vitals Value Taken Time  BP 119/72 06/12/19 1216  Temp    Pulse 88 06/12/19 1218  Resp 12 06/12/19 1218  SpO2 100 % 06/12/19 1218  Vitals shown include unvalidated device data.  Last Pain:  Vitals:   06/12/19 0640  TempSrc: Tympanic  PainSc: 10-Worst pain ever         Complications: No apparent anesthesia complications

## 2019-06-12 NOTE — Progress Notes (Signed)
Patient reported using creamer in his coffee this am at 0400.  Dr  Marcello Moores was informed of the patient's use of creamer in his coffee. Dr Marcello Moores delayed the surgery for 6 hours due to consumption of creamer. Dr Sabra Heck and patient were informed of the delay.

## 2019-06-12 NOTE — Anesthesia Procedure Notes (Signed)
Procedure Name: Intubation Performed by: Jailey Booton, CRNA Pre-anesthesia Checklist: Patient identified, Patient being monitored, Timeout performed, Emergency Drugs available and Suction available Patient Re-evaluated:Patient Re-evaluated prior to induction Oxygen Delivery Method: Circle system utilized Preoxygenation: Pre-oxygenation with 100% oxygen Induction Type: IV induction Ventilation: Mask ventilation without difficulty Laryngoscope Size: McGraph and 4 Grade View: Grade I Tube type: Oral Tube size: 7.0 mm Number of attempts: 1 Airway Equipment and Method: Stylet Placement Confirmation: ETT inserted through vocal cords under direct vision,  positive ETCO2 and breath sounds checked- equal and bilateral Secured at: 23 cm Tube secured with: Tape Dental Injury: Teeth and Oropharynx as per pre-operative assessment        

## 2019-06-13 NOTE — Anesthesia Postprocedure Evaluation (Signed)
Anesthesia Post Note  Patient: Corey Baker  Procedure(s) Performed: ARTHROSCOPY KNEE (Left Knee)  Patient location during evaluation: PACU Anesthesia Type: General Level of consciousness: awake and alert and oriented Pain management: pain level controlled Vital Signs Assessment: post-procedure vital signs reviewed and stable Respiratory status: spontaneous breathing Cardiovascular status: blood pressure returned to baseline Anesthetic complications: no     Last Vitals:  Vitals:   06/12/19 1306 06/12/19 1328  BP: (!) 148/87 (!) 148/85  Pulse: 66 67  Resp: 16 16  Temp: (!) 36.2 C   SpO2: 98% 100%    Last Pain:  Vitals:   06/12/19 1328  TempSrc:   PainSc: 6                  Carrianne Hyun

## 2019-08-15 ENCOUNTER — Other Ambulatory Visit: Payer: Self-pay | Admitting: Physician Assistant

## 2019-08-15 DIAGNOSIS — H9313 Tinnitus, bilateral: Secondary | ICD-10-CM

## 2019-08-22 ENCOUNTER — Ambulatory Visit: Payer: BC Managed Care – PPO

## 2020-10-04 IMAGING — CR CHEST - 2 VIEW
2 series · 2 of 2 positions shown · non-contrast
Comparison: 03/17/2018

CLINICAL DATA: Shortness of breath

EXAM:
CHEST - 2 VIEW

[chest lat]
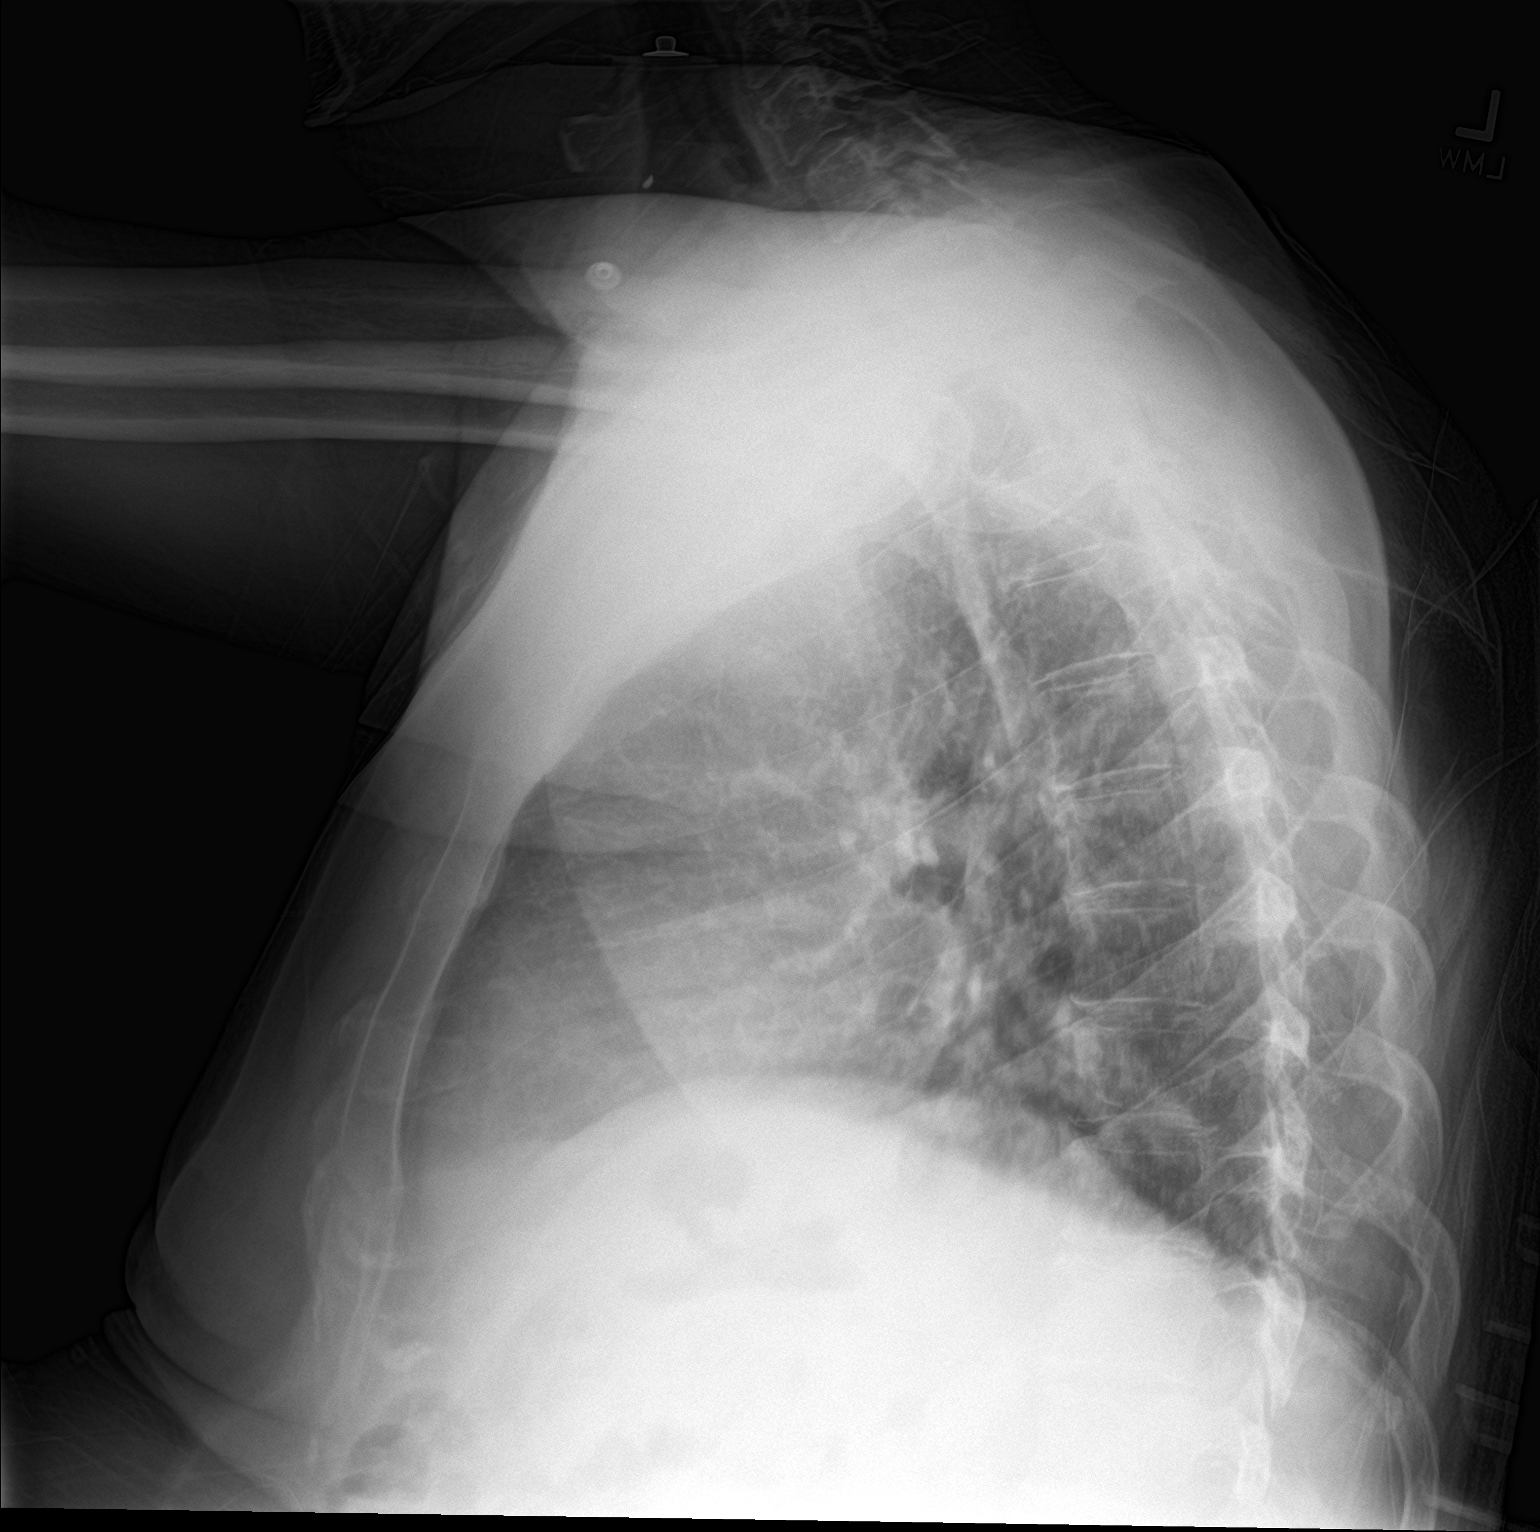

[chest ap]
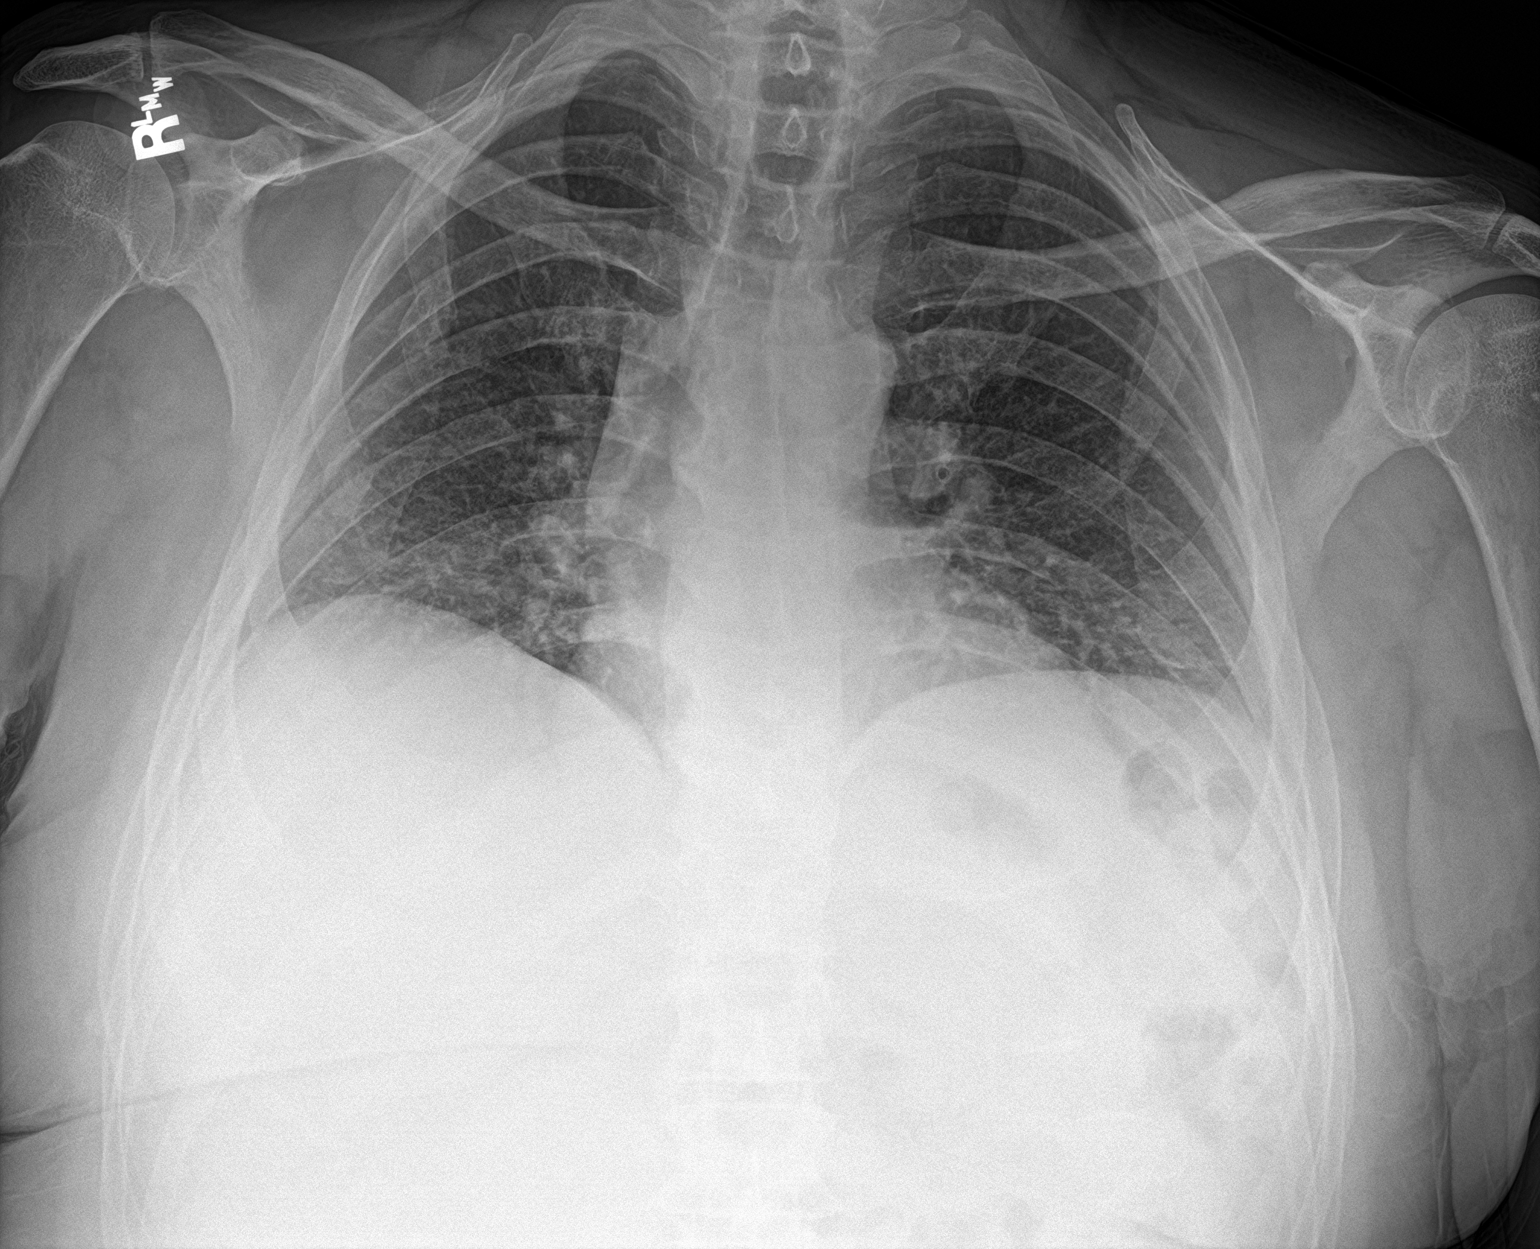

[2 of 2 positions shown; findings below may reference images not displayed]

FINDINGS: Low lung volumes with bibasilar atelectasis. Heart is normal size.
No effusions. No acute bony abnormality.
IMPRESSION: Low volumes, bibasilar atelectasis.

## 2020-10-04 IMAGING — CT CT HEAD WITHOUT CONTRAST
5 of 7 series · 17 of 47 positions shown, 18 images · non-contrast
Comparison: Head CT dated 03/17/2018

CLINICAL DATA: 57-year-old male with altered mental status.

EXAM:
CT HEAD WITHOUT CONTRAST
CT CERVICAL SPINE WITHOUT CONTRAST
TECHNIQUE: Multidetector CT imaging of the head and cervical spine was
performed following the standard protocol without intravenous
contrast. Multiplanar CT image reconstructions of the cervical spine
were also generated.

[Series 2: head wo · axial · 0.41mm/px · z∈[-149,-99]mm · 2 of 30 slices shown, 3 images]
[im 10/30  brain]
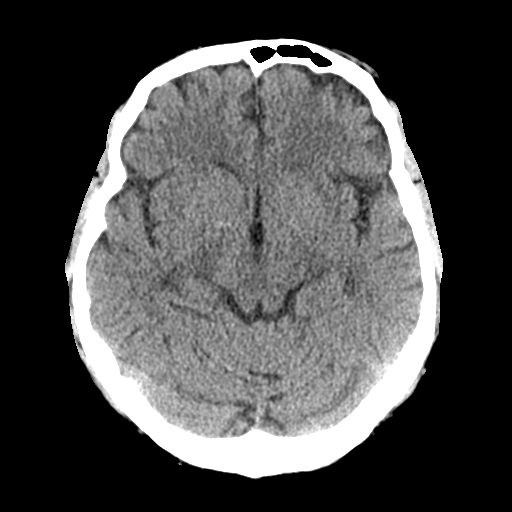
[im 10/30  bone]
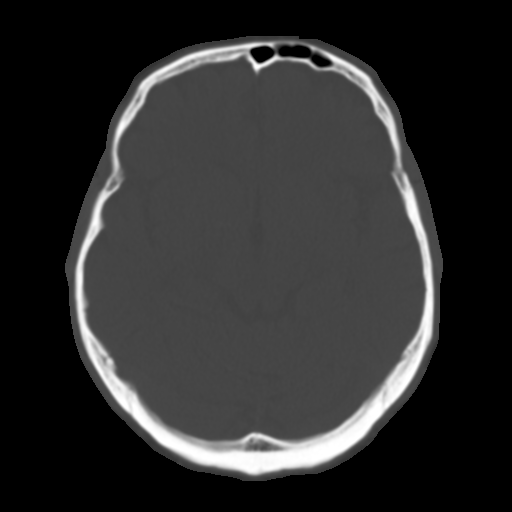
[im 20/30  brain]
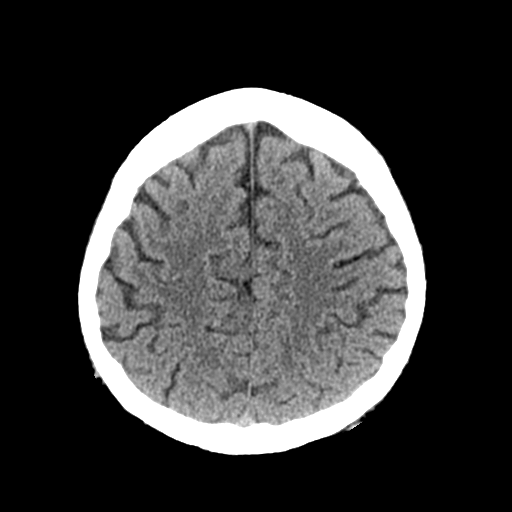

[Series 4: coronal soft tissue · coronal · 0.29mm/px · 3 of 61 slices shown]
[im 18/61  brain]
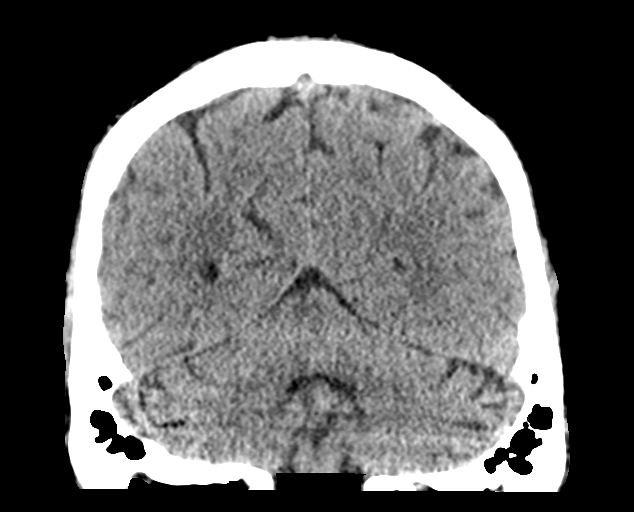
[im 26/61  brain]
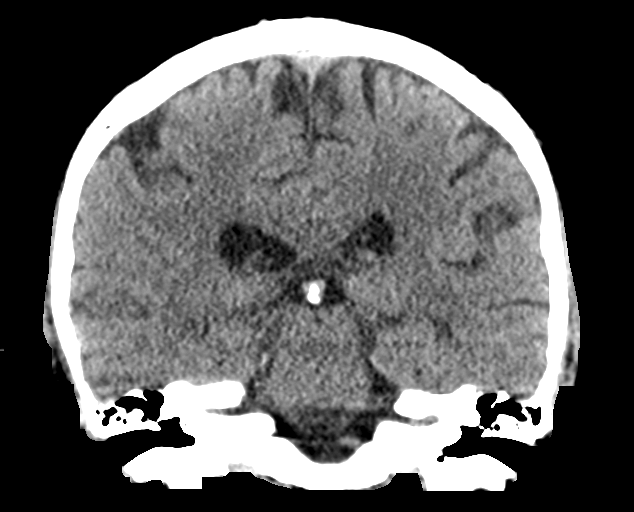
[im 35/61  brain]
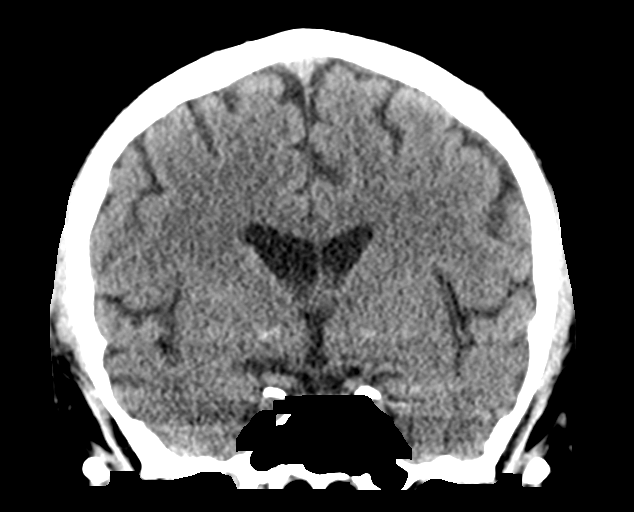

[Series 5: sagittal soft tissue · sagittal · 0.31mm/px · 1 of 54 slices shown]
[im 27/54  brain]
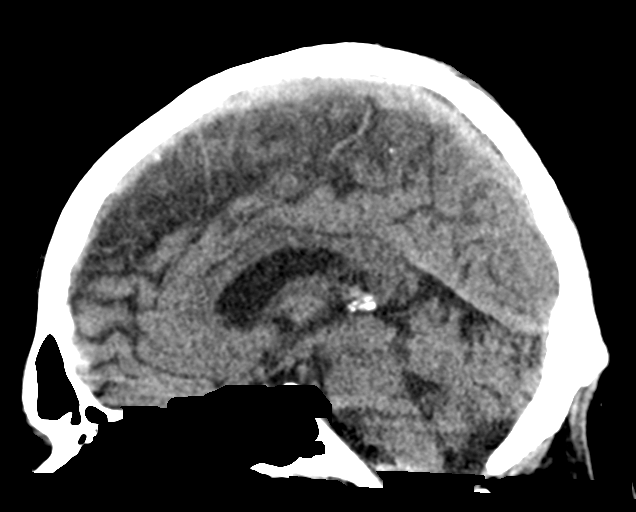

[Series 7: c spine soft · axial · 0.35mm/px · z∈[-351,-303]mm · 3 of 98 slices shown]
[im 9/98  brain]
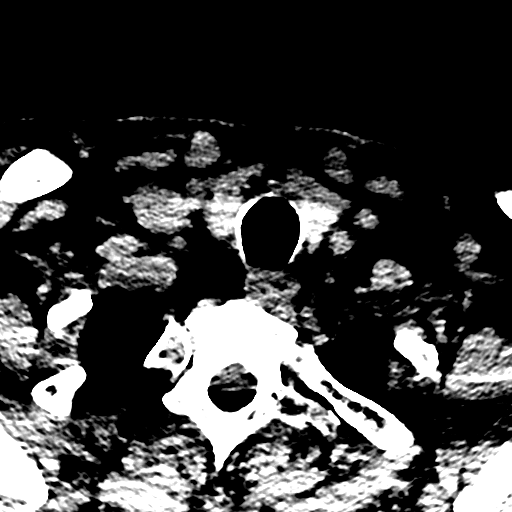
[im 25/98  brain]
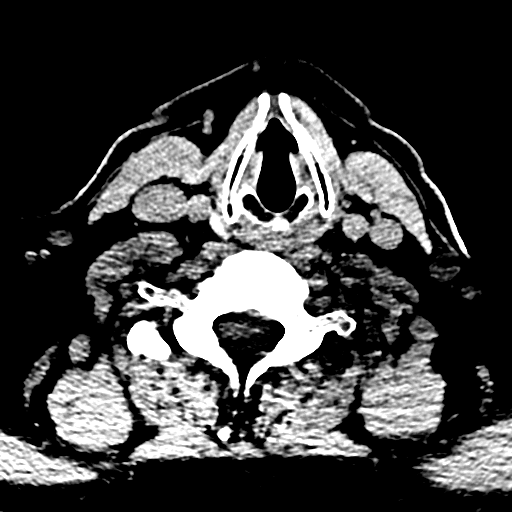
[im 33/98  brain]
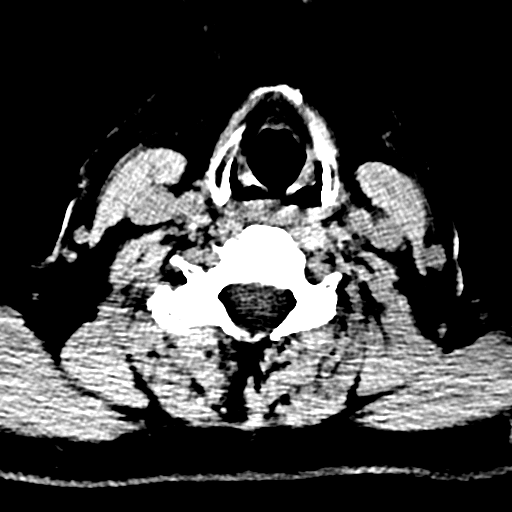

[Series 10: orthogonal bone · axial · 0.27mm/px · z∈[-376,-229]mm · 8 of 103 slices shown]
[im 9/103  bone]
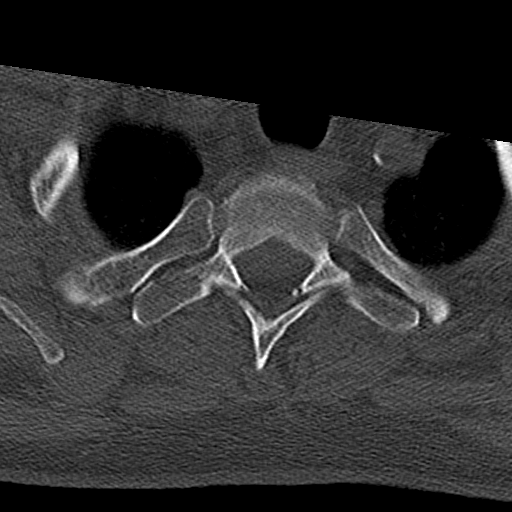
[im 26/103  bone]
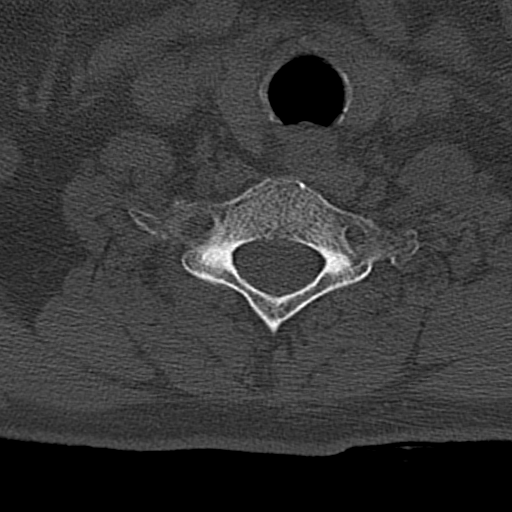
[im 35/103  bone]
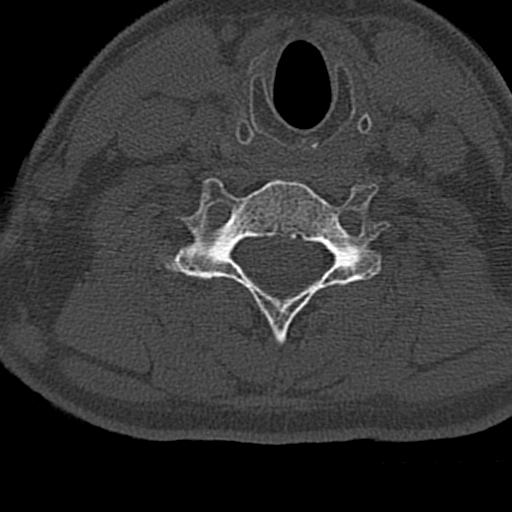
[im 43/103  bone]
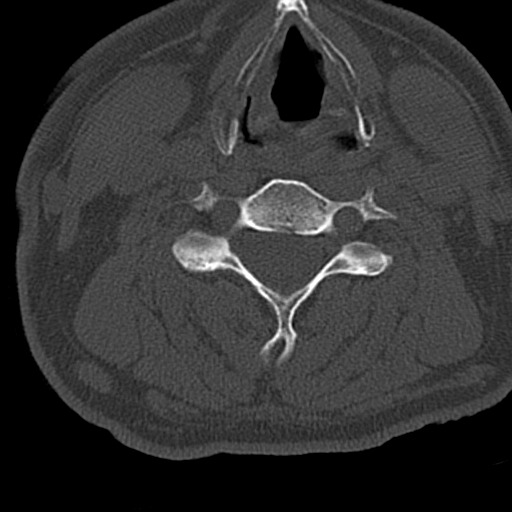
[im 60/103  bone]
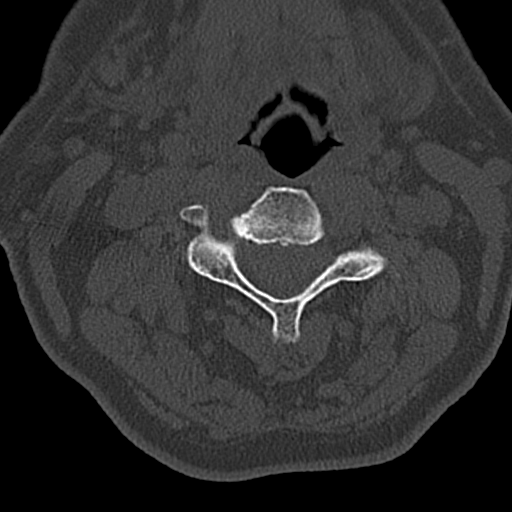
[im 69/103  bone]
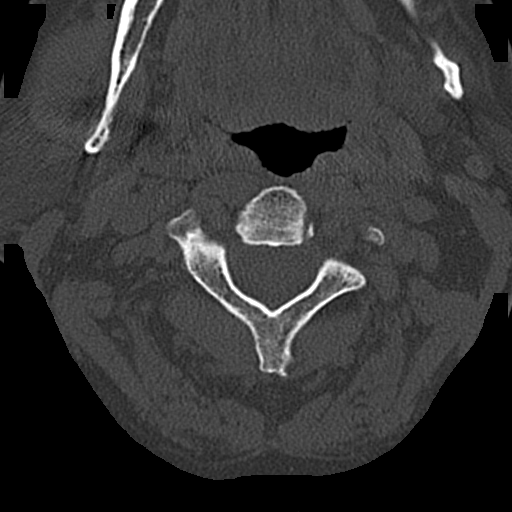
[im 77/103  bone]
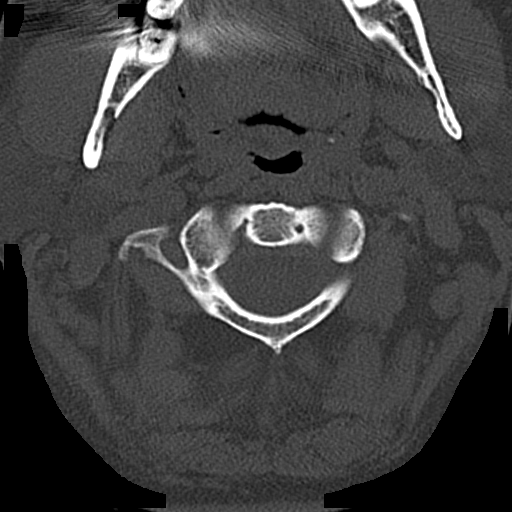
[im 94/103  bone]
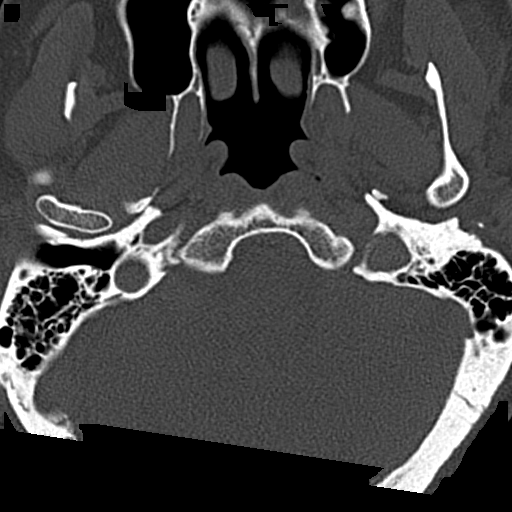

[17 of 47 positions shown; findings below may reference images not displayed]

FINDINGS: CT HEAD FINDINGS

Brain: There is mild age-related atrophy and chronic microvascular
ischemic changes. There is no acute intracranial hemorrhage. No mass
effect or midline shift. No extra-axial fluid collection.

Vascular: No hyperdense vessel or unexpected calcification.

Skull: Normal. Negative for fracture or focal lesion.

Sinuses/Orbits: No acute finding.

Other: None

CT CERVICAL SPINE FINDINGS

Alignment: No acute subluxation. There is straightening of normal
cervical lordosis which may be positional or due to muscle spasm.

Skull base and vertebrae: No acute fracture. No primary bone lesion
or focal pathologic process.

Soft tissues and spinal canal: No prevertebral fluid or swelling. No
visible canal hematoma.

Disc levels: No acute findings. Degenerative changes with disc space
narrowing most prominent at C4-C5.

Upper chest: Negative.

Other: None
IMPRESSION: 1. No acute intracranial hemorrhage. Mild age-related atrophy and
chronic microvascular ischemic changes.
2. No acute/traumatic cervical spine pathology.

## 2020-10-22 ENCOUNTER — Other Ambulatory Visit: Payer: Self-pay | Admitting: Urology

## 2020-10-22 ENCOUNTER — Other Ambulatory Visit (HOSPITAL_COMMUNITY): Payer: Self-pay | Admitting: Urology

## 2020-10-22 DIAGNOSIS — R972 Elevated prostate specific antigen [PSA]: Secondary | ICD-10-CM

## 2020-11-04 ENCOUNTER — Other Ambulatory Visit: Payer: Self-pay

## 2020-11-04 ENCOUNTER — Ambulatory Visit
Admission: RE | Admit: 2020-11-04 | Discharge: 2020-11-04 | Disposition: A | Discharge status: Home / Self Care | Payer: BC Managed Care – PPO | Source: Ambulatory Visit | Attending: Urology | Admitting: Urology

## 2020-11-04 DIAGNOSIS — R972 Elevated prostate specific antigen [PSA]: Secondary | ICD-10-CM | POA: Diagnosis not present

## 2020-11-04 MED ORDER — GADOBUTROL 1 MMOL/ML IV SOLN
9.0000 mL | Freq: Once | INTRAVENOUS | Status: AC | PRN
  Administered 2020-11-04: 9 mL via INTRAVENOUS

## 2020-11-14 ENCOUNTER — Other Ambulatory Visit: Payer: BC Managed Care – PPO

## 2020-11-29 NOTE — H&P (Signed)
Corey Baker, Corey MEDICAL RECORD NO: [id number] ACCOUNT NO: [id number] DATE OF BIRTH: [birth year] PHYSICIAN: Suszanne Conners. Evelene Croon, MD  History and Physical   DATE OF ADMISSION: 12/26/2020  CHIEF COMPLAINT:  Elevated PSA.  HISTORY OF PRESENT ILLNESS:  The patient is a 60 year old white male who was found to have an elevated PSA of 4.7 nanograms per mL on 04/05.  This was further evaluated with prostate gland MRI on 04/25, indicating a 28.54 mL prostate.  He had a 14 x 9 x  12 mm PI-RADS category 3 lesion of the right mid gland and a 20 x 12 x 16 mm PI-RADS category 3 lesion involving the left mid peripheral zone extending to the apex.  He comes in now for UroNav fusion biopsy of the prostate.  PAST MEDICAL HISTORY:  No drug allergies.  CURRENT MEDICATIONS:  Included Xyosted, tadalafil, and bupropion.  PAST SURGICAL HISTORY:  Negative.  PAST AND CURRENT MEDICAL CONDITIONS: 1.  Hypogonadism 2.  BPH with lower urinary tract symptoms. 3.  Possible neurogenic bladder. 4.  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 5.  Testicular atrophy. 6.  Erectile dysfunction. 7.  GERD. 8.  Hypertension.  REVIEW OF SYSTEMS:  The patient has chronic low back pain, pelvic pain, joint stiffness, joint pain, pain in the legs and numbness and tingling of his legs.  He denied chest pain, shortness of breath, diabetes or heart disease.  He has chronic insomnia.  SOCIAL HISTORY:  The patient denied tobacco use.  He consumes alcohol socially.  FAMILY HISTORY:  Father died at age 34 of lung cancer.  Mother died at age 19 for unknown reasons.  PHYSICAL EXAMINATION: VITAL SIGNS:  Height was 6 feet 1 inch, weight 200 pounds. GENERAL:  Well-nourished white male in no acute distress. HEENT:  Sclerae were clear.  Pupils are equally round, reactive to light and accommodation.  Extraocular movements are intact. NECK:  No palpable masses or tenderness. LYMPHATIC:  No palpable cervical or inguinal  adenopathy. PULMONARY:  Lungs clear to auscultation. CARDIOVASCULAR:  Regular rhythm and rate without audible murmurs. ABDOMEN:  Soft, nontender abdomen. GENITOURINARY:  He was circumcised.  Both testes were smooth, nontender, 16 mL in size each. RECTAL:  A 20 grams smooth nontender prostate. NEUROMUSCULAR:  Alert and oriented x3.  IMPRESSION: 1.  Elevated PSA. 2.  Abnormal prostate gland MRI scan.  PLAN:  UroNav fusion biopsy of prostate.   SHW D: 11/27/2020 8:40:17 am T: 11/27/2020 9:11:00 am  JOB: [id number]/ [id number]

## 2020-12-19 ENCOUNTER — Encounter
Admission: RE | Admit: 2020-12-19 | Discharge: 2020-12-19 | Disposition: A | Discharge status: Home / Self Care | Payer: BC Managed Care – PPO | Source: Ambulatory Visit | Attending: Urology | Admitting: Urology

## 2020-12-19 ENCOUNTER — Other Ambulatory Visit: Payer: Self-pay

## 2020-12-19 HISTORY — DX: Dorsalgia, unspecified: M54.9

## 2020-12-19 HISTORY — DX: Esophageal obstruction: K22.2

## 2020-12-19 NOTE — Patient Instructions (Signed)
Your procedure is scheduled on: December 26, 2020 THURSDAY Report to the Registration Desk on the 1st floor of the [facility] To find out your arrival time, please call [phone] between 1PM - 3PM on: Wednesday December 25, 2020  REMEMBER: Instructions that are not followed completely may result in serious medical risk, up to and including death; or upon the discretion of your surgeon and anesthesiologist your surgery may need to be rescheduled.  Do not eat food after midnight the night before surgery.  No gum chewing, lozengers or hard candies.  You may however, drink CLEAR liquids up to 2 hours before you are scheduled to arrive for your surgery. Do not drink anything within 2 hours of your scheduled arrival time.  Clear liquids include: - water  - apple juice without pulp - gatorade (not RED, PURPLE, OR BLUE) - black coffee or tea (Do NOT add milk or creamers to the coffee or tea) Do NOT drink anything that is not on this list.   TAKE THESE MEDICATIONS THE MORNING OF SURGERY WITH A SIP OF WATER: ATORVASTATIN BUPROPION PROTONIX  (take one the night before and one on the morning of surgery - helps to prevent nausea after surgery.)   One week prior to surgery: Stop Anti-inflammatories (NSAIDS) such as Advil, Aleve, Ibuprofen, Motrin, Naproxen, Naprosyn and Aspirin based products such as Excedrin, Goodys Powder, BC Powder. STOP CELEBREX, FLECTOR AND VOLTAREN Stop ANY OVER THE COUNTER supplements until after surgery. You may however, continue to take Tylenol if needed for pain up until the day of surgery.  No Alcohol for 24 hours before or after surgery.  No Smoking including e-cigarettes for 24 hours prior to surgery.  No chewable tobacco products for at least 6 hours prior to surgery.  No nicotine patches on the day of surgery.  Do not use any "recreational" drugs for at least a week prior to your surgery.  Please be advised that the combination of cocaine and anesthesia may  have negative outcomes, up to and including death. If you test positive for cocaine, your surgery will be cancelled.  On the morning of surgery brush your teeth with toothpaste and water, you may rinse your mouth with mouthwash if you wish. Do not swallow any toothpaste or mouthwash.  Do not wear jewelry, make-up, hairpins, clips or nail polish.  Do not wear lotions, powders, or perfumes OR DEODORANT   Do not shave body from the neck down 48 hours prior to surgery just in case you cut yourself which could leave a site for infection.  Also, freshly shaved skin may become irritated if using the CHG soap.  Contact lenses, hearing aids and dentures may not be worn into surgery.  Do not bring valuables to the hospital. [facility] is not responsible for any missing/lost belongings or valuables.   SHOWER MORNING OF SURGERY  Notify your doctor if there is any change in your medical condition (cold, fever, infection).  Wear comfortable clothing (specific to your surgery type) to the hospital.  Plan for stool softeners for home use; pain medications have a tendency to cause constipation. You can also help prevent constipation by eating foods high in fiber such as fruits and vegetables and drinking plenty of fluids as your diet allows.  After surgery, you can help prevent lung complications by doing breathing exercises.  Take deep breaths and cough every 1-2 hours. Your doctor may order a device called an Incentive Spirometer to help you take deep breaths. When coughing  or sneezing, hold a pillow firmly against your incision with both hands. This is called "splinting." Doing this helps protect your incision. It also decreases belly discomfort.  If you are being discharged the day of surgery, you will not be allowed to drive home. You will need a responsible adult (18 years or older) to drive you home and stay with you that night.   If you are taking public transportation, you will need to have  a responsible adult (18 years or older) with you. Please confirm with your physician that it is acceptable to use public transportation.   Please call the Pre-admissions Testing Dept. at [phone] if you have any questions about these instructions.  Surgery Visitation Policy:  Patients undergoing a surgery or procedure may have one family member or support person with them as long as that person is not COVID-19 positive or experiencing its symptoms.  That person may remain in the waiting area during the procedure.  Inpatient Visitation:    Visiting hours are 7 a.m. to 8 p.m. Inpatients will be allowed two visitors daily. The visitors may change each day during the patient's stay. No visitors under the age of 59. Any visitor under the age of 54 must be accompanied by an adult. The visitor must pass COVID-19 screenings, use hand sanitizer when entering and exiting the patient's room and wear a mask at all times, including in the patient's room. Patients must also wear a mask when staff or their visitor are in the room. Masking is required regardless of vaccination status.  Total Hip/Knee Replacement Preoperative Educational Video  To better prepare for surgery, please view our videos that explain the physical activity and discharge planning required to have the best surgical recovery at [facility].  [email or web address]      Questions? Call [phone] or email [email or web address]

## 2020-12-20 ENCOUNTER — Other Ambulatory Visit
Admission: RE | Admit: 2020-12-20 | Discharge: 2020-12-20 | Disposition: A | Discharge status: Home / Self Care | Payer: BC Managed Care – PPO | Source: Ambulatory Visit | Attending: Urology | Admitting: Urology

## 2020-12-20 DIAGNOSIS — Z01818 Encounter for other preprocedural examination: Secondary | ICD-10-CM | POA: Diagnosis present

## 2020-12-20 DIAGNOSIS — I10 Essential (primary) hypertension: Secondary | ICD-10-CM | POA: Diagnosis not present

## 2020-12-20 LAB — BASIC METABOLIC PANEL
Anion gap: 8 (ref 5–15)
BUN: 20 mg/dL (ref 6–20)
CO2: 28 mmol/L (ref 22–32)
Calcium: 8.9 mg/dL (ref 8.9–10.3)
Chloride: 105 mmol/L (ref 98–111)
Creatinine, Ser: 1.09 mg/dL (ref 0.61–1.24)
GFR, Estimated: >60 mL/min (ref >60)
Glucose, Bld: 109 mg/dL — ABNORMAL HIGH (ref 70–99)
Potassium: 3.7 mmol/L (ref 3.5–5.1)
Sodium: 141 mmol/L (ref 135–145)

## 2020-12-24 ENCOUNTER — Other Ambulatory Visit: Payer: BC Managed Care – PPO

## 2020-12-26 ENCOUNTER — Ambulatory Visit: Payer: BC Managed Care – PPO | Admitting: Anesthesiology

## 2020-12-26 ENCOUNTER — Ambulatory Visit
Admission: RE | Admit: 2020-12-26 | Discharge: 2020-12-26 | Disposition: A | Discharge status: Home / Self Care | Payer: BC Managed Care – PPO | Attending: Urology | Admitting: Urology

## 2020-12-26 ENCOUNTER — Encounter: Payer: Self-pay | Admitting: Urology

## 2020-12-26 ENCOUNTER — Encounter
Admission: RE | Disposition: A | Discharge status: Home / Self Care | Payer: Self-pay | Source: Home / Self Care | Attending: Urology

## 2020-12-26 ENCOUNTER — Other Ambulatory Visit: Payer: Self-pay

## 2020-12-26 DIAGNOSIS — N401 Enlarged prostate with lower urinary tract symptoms: Secondary | ICD-10-CM | POA: Diagnosis not present

## 2020-12-26 DIAGNOSIS — N529 Male erectile dysfunction, unspecified: Secondary | ICD-10-CM | POA: Insufficient documentation

## 2020-12-26 DIAGNOSIS — N500 Atrophy of testis: Secondary | ICD-10-CM | POA: Diagnosis not present

## 2020-12-26 DIAGNOSIS — R972 Elevated prostate specific antigen [PSA]: Secondary | ICD-10-CM | POA: Insufficient documentation

## 2020-12-26 HISTORY — PX: PROSTATE BIOPSY: SHX241

## 2020-12-26 SURGERY — BIOPSY, PROSTATE
Anesthesia: General

## 2020-12-26 MED ORDER — FENTANYL CITRATE (PF) 100 MCG/2ML IJ SOLN
INTRAMUSCULAR | Status: AC
  Filled 2020-12-26: qty 2

## 2020-12-26 MED ORDER — LIDOCAINE HCL (PF) 2 % IJ SOLN
INTRAMUSCULAR | Status: DC | PRN
  Administered 2020-12-26: 80 mg via INTRADERMAL

## 2020-12-26 MED ORDER — MIDAZOLAM HCL 2 MG/2ML IJ SOLN
INTRAMUSCULAR | Status: DC | PRN
  Administered 2020-12-26 (×2): 1 mg via INTRAVENOUS

## 2020-12-26 MED ORDER — LACTATED RINGERS IV SOLN: INTRAVENOUS | Status: DC

## 2020-12-26 MED ORDER — SODIUM CHLORIDE 0.9 % IV SOLN
INTRAVENOUS | Status: AC
  Filled 2020-12-26: qty 10

## 2020-12-26 MED ORDER — PROPOFOL 10 MG/ML IV BOLUS
INTRAVENOUS | Status: AC
  Filled 2020-12-26: qty 20

## 2020-12-26 MED ORDER — FLEET ENEMA 7-19 GM/118ML RE ENEM: 1.0000 | ENEMA | Freq: Once | RECTAL | Status: DC

## 2020-12-26 MED ORDER — PROPOFOL 500 MG/50ML IV EMUL
INTRAVENOUS | Status: DC | PRN
  Administered 2020-12-26: 180 ug/kg/min via INTRAVENOUS

## 2020-12-26 MED ORDER — SODIUM CHLORIDE 0.9 % IV SOLN
1.0000 g | Freq: Once | INTRAVENOUS | Status: AC
  Administered 2020-12-26: 1 g via INTRAVENOUS

## 2020-12-26 MED ORDER — CHLORHEXIDINE GLUCONATE 0.12 % MT SOLN: 15.0000 mL | Freq: Once | OROMUCOSAL | Status: AC

## 2020-12-26 MED ORDER — ACETAMINOPHEN 325 MG PO TABS: 325.0000 mg | ORAL_TABLET | ORAL | Status: DC | PRN

## 2020-12-26 MED ORDER — HYDROCODONE-ACETAMINOPHEN 7.5-325 MG PO TABS: 1.0000 | ORAL_TABLET | Freq: Once | ORAL | Status: DC | PRN

## 2020-12-26 MED ORDER — ACETAMINOPHEN 160 MG/5ML PO SOLN
325.0000 mg | ORAL | Status: DC | PRN
  Filled 2020-12-26: qty 20.3

## 2020-12-26 MED ORDER — LEVOFLOXACIN 500 MG PO TABS: 500.0000 mg | ORAL_TABLET | Freq: Every day | ORAL | 0 refills | Status: AC

## 2020-12-26 MED ORDER — LIDOCAINE HCL (PF) 2 % IJ SOLN
INTRAMUSCULAR | Status: AC
  Filled 2020-12-26: qty 5

## 2020-12-26 MED ORDER — GENTAMICIN SULFATE 40 MG/ML IJ SOLN
80.0000 mg | Freq: Once | INTRAVENOUS | Status: DC
  Filled 2020-12-26: qty 2

## 2020-12-26 MED ORDER — FENTANYL CITRATE (PF) 100 MCG/2ML IJ SOLN
INTRAMUSCULAR | Status: DC | PRN
  Administered 2020-12-26 (×2): 50 ug via INTRAVENOUS

## 2020-12-26 MED ORDER — CHLORHEXIDINE GLUCONATE 0.12 % MT SOLN
OROMUCOSAL | Status: AC
  Administered 2020-12-26: 15 mL via OROMUCOSAL
  Filled 2020-12-26: qty 15

## 2020-12-26 MED ORDER — MIDAZOLAM HCL 2 MG/2ML IJ SOLN
INTRAMUSCULAR | Status: AC
  Filled 2020-12-26: qty 2

## 2020-12-26 MED ORDER — ONDANSETRON HCL 4 MG/2ML IJ SOLN: 4.0000 mg | Freq: Once | INTRAMUSCULAR | Status: DC | PRN

## 2020-12-26 MED ORDER — GENTAMICIN IN SALINE 1.6-0.9 MG/ML-% IV SOLN
80.0000 mg | Freq: Once | INTRAVENOUS | Status: AC
  Administered 2020-12-26: 80 mg via INTRAVENOUS
  Filled 2020-12-26: qty 50

## 2020-12-26 MED ORDER — ORAL CARE MOUTH RINSE: 15.0000 mL | Freq: Once | OROMUCOSAL | Status: AC

## 2020-12-26 MED ORDER — ONDANSETRON HCL 4 MG/2ML IJ SOLN
INTRAMUSCULAR | Status: DC | PRN
  Administered 2020-12-26: 4 mg via INTRAVENOUS

## 2020-12-26 SURGICAL SUPPLY — 22 items
COVER MAYO STAND REUSABLE (DRAPES) ×3 IMPLANT
COVER TRANSDUCER ULTRASOUND (MISCELLANEOUS) ×2 IMPLANT
DRSG TELFA 3X8 NADH (GAUZE/BANDAGES/DRESSINGS) ×3 IMPLANT
GLOVE SURG ENC MOIS LTX SZ7 (GLOVE) ×6 IMPLANT
GUIDE NDL ENDOCAV 16-18 CVR (NEEDLE) IMPLANT
GUIDE NDL URONAV ULTRASND S (MISCELLANEOUS) IMPLANT
GUIDE NEEDLE ENDOCAV 16-18 CVR (NEEDLE) IMPLANT
GUIDE NEEDLE URONAV ULTRASND S (MISCELLANEOUS) ×1 IMPLANT
INST BIOPSY MAXCORE 18GX25 (NEEDLE) ×3 IMPLANT
MANIFOLD NEPTUNE II (INSTRUMENTS) ×1 IMPLANT
NDL GUIDE BIOPSY 644068 (NEEDLE) IMPLANT
NEEDLE GUIDE BIOPSY 644068 (NEEDLE) IMPLANT
PAD DRESSING TELFA 3X8 NADH (GAUZE/BANDAGES/DRESSINGS) IMPLANT
PROBE BIOSP ALOKA ALPHA6 PROST (MISCELLANEOUS) ×2 IMPLANT
PROBE URONAV BK 8808E 8818 HLD (MISCELLANEOUS) IMPLANT
STRAP SAFETY 5IN WIDE (MISCELLANEOUS) ×3 IMPLANT
SURGILUBE 2OZ TUBE FLIPTOP (MISCELLANEOUS) ×3 IMPLANT
TOWEL OR 17X26 4PK STRL BLUE (TOWEL DISPOSABLE) ×3 IMPLANT
URONAV BK 8808E 8818 PROBE HLD (MISCELLANEOUS) ×3
URONAV MRI FUSION TWO PATIENTS (MISCELLANEOUS) ×2 IMPLANT
URONAV ULTRASOUND (MISCELLANEOUS) ×2 IMPLANT
URONAV ULTRASOUND NDL GUIDE S (MISCELLANEOUS) ×3

## 2020-12-26 NOTE — H&P (Signed)
Date of Initial H&P: 11/27/20  History reviewed, patient examined, no change in status, stable for surgery.

## 2020-12-26 NOTE — Discharge Instructions (Addendum)
See aboveAMBULATORY SURGERY  DISCHARGE INSTRUCTIONS   The drugs that you were given will stay in your system until tomorrow so for the next 24 hours you should not:  Drive an automobile Make any legal decisions Drink any alcoholic beverage   You may resume regular meals tomorrow.  Today it is better to start with liquids and gradually work up to solid foods.  You may eat anything you prefer, but it is better to start with liquids, then soup and crackers, and gradually work up to solid foods.   Please notify your doctor immediately if you have any unusual bleeding, trouble breathing, redness and pain at the surgery site, drainage, fever, or pain not relieved by medication.    Please contact your physician with any problems or Same Day Surgery at [phone], Monday through Friday 6 am to 4 pm, or [place] [facility] [place] Main number at [phone].  

## 2020-12-26 NOTE — Op Note (Signed)
Preoperative diagnosis: 1.  Elevated PSA (C61)                                           2.  Abnormal prostate gland MRI scan (S96.28)  Postoperative diagnosis: Same  Procedure: Transrectal Uronav fusion biopsy of the prostate                       (CPT 55700, [phone]) Surgeon: Suszanne Conners. Evelene Croon MD  Anesthesia: General  Indications:See the history and physical. After informed consent the above procedure(s) were requested     Technique and findings: After adequate general anesthesia been obtained the patient was placed into left lateral decubitus position and DRE was performed.  Rectal vault was clear.  Ultrasound probe was placed and images acquired.  The ultrasound images were then fused with the MRI images and region of interest #1 on the right side was identified and 3 core biopsies obtained.  Region of interest #2 on the left side was identified and 3 core biopsies obtained here.  At this point standard 12 core systematic core biopsies were performed.  The ultrasound probe was removed.  Blood loss was minimal.  The procedure was then terminated and patient transferred to the recovery room in stable condition.

## 2020-12-26 NOTE — Anesthesia Procedure Notes (Signed)
Date/Time: 12/26/2020 9:35 AM Performed by: Henrietta Hoover, CRNA Pre-anesthesia Checklist: Emergency Drugs available, Suction available, Patient being monitored, Timeout performed and Patient identified Patient Re-evaluated:Patient Re-evaluated prior to induction Oxygen Delivery Method: Nasal cannula Placement Confirmation: positive ETCO2

## 2020-12-26 NOTE — Anesthesia Postprocedure Evaluation (Signed)
Anesthesia Post Note  Patient: Corey Baker  Procedure(s) Performed: PROSTATE BIOPSY  URONAV  Patient location during evaluation: PACU Anesthesia Type: General Level of consciousness: awake and alert Pain management: pain level controlled Vital Signs Assessment: post-procedure vital signs reviewed and stable Respiratory status: spontaneous breathing, nonlabored ventilation and respiratory function stable Cardiovascular status: blood pressure returned to baseline and stable Postop Assessment: no apparent nausea or vomiting Anesthetic complications: no   No notable events documented.   Last Vitals:  Vitals:   12/26/20 1030 12/26/20 1043  BP: (!) 150/83 131/77  Pulse: 75 80  Resp: (!) 23 16  Temp: (!) 36.3 C 36.4 C  SpO2: 96% 100%    Last Pain:  Vitals:   12/26/20 1030  TempSrc:   PainSc: 0-No pain                 Christia Reading

## 2020-12-26 NOTE — Transfer of Care (Signed)
Immediate Anesthesia Transfer of Care Note  Patient: Quantarius Genrich  Procedure(s) Performed: PROSTATE BIOPSY  URONAV  Patient Location: PACU  Anesthesia Type:General  Level of Consciousness: awake, alert  and oriented  Airway & Oxygen Therapy: Patient Spontanous Breathing and Patient connected to nasal cannula oxygen  Post-op Assessment: Report given to RN and Post -op Vital signs reviewed and stable  Post vital signs: Reviewed and stable  Last Vitals:  Vitals Value Taken Time  BP 103/68 12/26/20 1011  Temp 36.6 C 12/26/20 1011  Pulse 72 12/26/20 1015  Resp 22 12/26/20 1012  SpO2 100 % 12/26/20 1015  Vitals shown include unvalidated device data.  Last Pain:  Vitals:   12/26/20 1011  TempSrc:   PainSc: 0-No pain         Complications: No notable events documented.

## 2020-12-26 NOTE — Anesthesia Preprocedure Evaluation (Addendum)
Anesthesia Evaluation  Patient identified by MRN, date of birth, ID band Patient awake    Reviewed: Allergy & Precautions, H&P , NPO status , reviewed documented beta blocker date and time   Airway Mallampati: II  TM Distance: >3 FB Neck ROM: full    Dental  (+) Chipped, Caps   Pulmonary    Pulmonary exam normal        Cardiovascular hypertension, Pt. on medications Normal cardiovascular exam     Neuro/Psych PSYCHIATRIC DISORDERS Anxiety Depression    GI/Hepatic GERD  Medicated and Controlled,  Endo/Other    Renal/GU      Musculoskeletal   Abdominal   Peds  Hematology   Anesthesia Other Findings Past Medical History: No date: Alcohol abuse No date: Back pain     Comment:  FROM BROKEN PELVIS No date: Depression with anxiety No date: Esophageal stricture No date: GERD (gastroesophageal reflux disease) No date: HLD (hyperlipidemia) No date: HTN (hypertension)  Past Surgical History: No date: BACK SURGERY No date: ELBOW SURGERY; Right 06/12/2019: KNEE ARTHROSCOPY; Left     Comment:  Procedure: ARTHROSCOPY KNEE;  Surgeon: Deeann Saint,               MD;  Location: [facility] ORS;  Service: Orthopedics;                Laterality: Left;  BMI    Body Mass Index: 25.73 kg/m      Reproductive/Obstetrics                            Anesthesia Physical Anesthesia Plan  ASA: 2  Anesthesia Plan: General   Post-op Pain Management:    Induction: Intravenous  PONV Risk Score and Plan: 2 and Midazolam, Treatment may vary due to age or medical condition and TIVA  Airway Management Planned: Nasal Cannula and Natural Airway  Additional Equipment:   Intra-op Plan:   Post-operative Plan:   Informed Consent: I have reviewed the patients History and Physical, chart, labs and discussed the procedure including the risks, benefits and alternatives for the proposed anesthesia with the patient or  authorized representative who has indicated his/her understanding and acceptance.     Dental Advisory Given  Plan Discussed with: CRNA  Anesthesia Plan Comments:         Anesthesia Quick Evaluation

## 2020-12-31 LAB — SURGICAL PATHOLOGY

## 2022-02-10 DEATH — deceased
